# Patient Record
Sex: Female | Born: 1965 | Race: Black or African American | Hispanic: No | Marital: Single | Smoking: Current every day smoker
Health system: Southern US, Community
[De-identification: ages and names within clinical notes are randomized; demographics above are authoritative.]

## PROBLEM LIST (undated history)

## (undated) DIAGNOSIS — J45909 Unspecified asthma, uncomplicated: Secondary | ICD-10-CM

## (undated) HISTORY — PX: TUBAL LIGATION: SHX77

## (undated) HISTORY — PX: KNEE SURGERY: SHX244

---

## 2012-02-16 ENCOUNTER — Emergency Department (HOSPITAL_COMMUNITY)
Admission: EM | Admit: 2012-02-16 | Discharge: 2012-02-16 | Disposition: A | Discharge status: Home / Self Care | Payer: Self-pay | Attending: Emergency Medicine | Admitting: Emergency Medicine

## 2012-02-16 ENCOUNTER — Encounter (HOSPITAL_COMMUNITY): Payer: Self-pay | Admitting: Emergency Medicine

## 2012-02-16 DIAGNOSIS — L0291 Cutaneous abscess, unspecified: Secondary | ICD-10-CM

## 2012-02-16 DIAGNOSIS — F17200 Nicotine dependence, unspecified, uncomplicated: Secondary | ICD-10-CM | POA: Insufficient documentation

## 2012-02-16 DIAGNOSIS — IMO0002 Reserved for concepts with insufficient information to code with codable children: Secondary | ICD-10-CM | POA: Insufficient documentation

## 2012-02-16 DIAGNOSIS — J45909 Unspecified asthma, uncomplicated: Secondary | ICD-10-CM | POA: Insufficient documentation

## 2012-02-16 HISTORY — DX: Unspecified asthma, uncomplicated: J45.909

## 2012-02-16 MED ORDER — IBUPROFEN 600 MG PO TABS: 600.0000 mg | ORAL_TABLET | Freq: Four times a day (QID) | ORAL | Status: DC | PRN

## 2012-02-16 MED ORDER — SULFAMETHOXAZOLE-TRIMETHOPRIM 800-160 MG PO TABS: 1.0000 | ORAL_TABLET | Freq: Two times a day (BID) | ORAL | Status: DC

## 2012-02-16 MED ORDER — OXYCODONE-ACETAMINOPHEN 5-325 MG PO TABS: 2.0000 | ORAL_TABLET | ORAL | Status: DC | PRN

## 2012-02-16 MED ORDER — CLINDAMYCIN HCL 150 MG PO CAPS: 150.0000 mg | ORAL_CAPSULE | Freq: Four times a day (QID) | ORAL | Status: DC

## 2012-02-16 MED ORDER — OXYCODONE-ACETAMINOPHEN 5-325 MG PO TABS
2.0000 | ORAL_TABLET | Freq: Once | ORAL | Status: AC
  Administered 2012-02-16: 2 via ORAL
  Filled 2012-02-16: qty 2

## 2012-02-16 MED ORDER — CLINDAMYCIN HCL 150 MG PO CAPS
300.0000 mg | ORAL_CAPSULE | Freq: Once | ORAL | Status: AC
  Administered 2012-02-16: 300 mg via ORAL
  Filled 2012-02-16: qty 2

## 2012-02-16 MED ORDER — SULFAMETHOXAZOLE-TMP DS 800-160 MG PO TABS
1.0000 | ORAL_TABLET | Freq: Once | ORAL | Status: AC
  Administered 2012-02-16: 1 via ORAL
  Filled 2012-02-16: qty 1

## 2012-02-16 NOTE — ED Notes (Signed)
Pt states understanding of discharge instructions 

## 2012-02-16 NOTE — ED Provider Notes (Signed)
Medical screening examination/treatment/procedure(s) were performed by non-physician practitioner and as supervising physician I was immediately available for consultation/collaboration.  Derwood Kaplan, MD 02/16/12 [phone]

## 2012-02-16 NOTE — ED Notes (Signed)
Pt has abscess to L axilla, noticed about 1.5 days ago, pt has redness and swelling to under arm

## 2012-02-16 NOTE — ED Provider Notes (Signed)
History     CSN: [id number]  Arrival date & time 02/16/12  4098   First MD Initiated Contact with Patient 02/16/12 1958      Chief Complaint  Patient presents with  . Abscess    (Consider location/radiation/quality/duration/timing/severity/associated sxs/prior treatment) HPI Comments: Patient noticed a swollen area in her left axilla, week, and half, ago, that progressively got worse.  Several days ago.  It spontaneously ruptured after applying warm compresses over the last 24 hours increased in size and redness.  Pain has increased as well.  She denies generalized myalgias, or fever  Patient is a 46 y.o. female presenting with abscess. The history is provided by the patient.  Abscess  This is a new problem. The current episode started more than one week ago. The abscess is present on the left arm. The problem is severe. The abscess is characterized by redness, painfulness and swelling. The patient was exposed to OTC medications. Pertinent negatives include no fever.    Past Medical History  Diagnosis Date  . Asthma     Past Surgical History  Procedure Date  . Knee surgery   . Tubal ligation     No family history on file.  History  Substance Use Topics  . Smoking status: Current Every Day Smoker -- 0.5 packs/day    Types: Cigarettes  . Smokeless tobacco: Not on file  . Alcohol Use: Yes     Comment: occasion    OB History    Grav Para Term Preterm Abortions TAB SAB Ect Mult Living                  Review of Systems  Constitutional: Negative for fever and chills.  Gastrointestinal: Negative for nausea.  Musculoskeletal: Negative for joint swelling.  Skin: Positive for wound.  Neurological: Negative for dizziness and headaches.    Allergies  Amoxicillin  Home Medications  No current outpatient prescriptions on file.  BP 148/82  Pulse 105  Temp 98.2 F (36.8 C) (Oral)  Resp 18  SpO2 97%  LMP 02/13/2012  Physical Exam  Constitutional: She is  oriented to person, place, and time. She appears well-developed and well-nourished.  HENT:  Head: Normocephalic.  Eyes: Pupils are equal, round, and reactive to light.  Neck: Normal range of motion.  Cardiovascular: Normal rate.   Pulmonary/Chest: Effort normal.  Musculoskeletal: Normal range of motion. She exhibits tenderness.       In the left axilla  Neurological: She is alert and oriented to person, place, and time.  Skin: Skin is warm. There is erythema.       Abscess in the left axilla, approximately 10 cm by 5 cm no active drainage at this time    ED Course  INCISION AND DRAINAGE Date/Time: 02/16/2012 9:40 PM Performed by: Arman Filter Authorized by: Arman Filter Consent: Verbal consent obtained. Risks and benefits: risks, benefits and alternatives were discussed Consent given by: patient Patient understanding: patient states understanding of the procedure being performed Patient identity confirmed: verbally with patient Time out: Immediately prior to procedure a "time out" was called to verify the correct patient, procedure, equipment, support staff and site/side marked as required. Type: abscess Body area: upper extremity Location details: left arm Anesthesia: local infiltration Local anesthetic: lidocaine 2% with epinephrine Anesthetic total: 5 ml Patient sedated: no Scalpel size: 11 Needle gauge: 22 Incision type: elliptical Drainage: purulent Drainage amount: copious Wound treatment: wound left open Patient tolerance: Patient tolerated the procedure well with no immediate  complications.   (including critical care time)  Labs Reviewed - No data to display No results found.   No diagnosis found.    MDM   Emesis was approximately 3 cm deep loculations were broken, elliptical incision made approximately 2 cm long.  Patient was placed on clindamycin and Bactrim        Arman Filter, NP 02/16/12 2144

## 2012-02-16 NOTE — ED Notes (Signed)
Abcess x 1 week under left axilla

## 2013-04-21 ENCOUNTER — Encounter (HOSPITAL_COMMUNITY): Payer: Self-pay | Admitting: Emergency Medicine

## 2013-04-21 ENCOUNTER — Emergency Department (INDEPENDENT_AMBULATORY_CARE_PROVIDER_SITE_OTHER)
Admission: EM | Admit: 2013-04-21 | Discharge: 2013-04-21 | Disposition: A | Discharge status: Home / Self Care | Payer: Self-pay | Source: Home / Self Care | Attending: Emergency Medicine | Admitting: Emergency Medicine

## 2013-04-21 DIAGNOSIS — R69 Illness, unspecified: Secondary | ICD-10-CM

## 2013-04-21 DIAGNOSIS — J111 Influenza due to unidentified influenza virus with other respiratory manifestations: Secondary | ICD-10-CM

## 2013-04-21 DIAGNOSIS — IMO0001 Reserved for inherently not codable concepts without codable children: Secondary | ICD-10-CM

## 2013-04-21 DIAGNOSIS — R030 Elevated blood-pressure reading, without diagnosis of hypertension: Secondary | ICD-10-CM

## 2013-04-21 MED ORDER — OSELTAMIVIR PHOSPHATE 75 MG PO CAPS: 75.0000 mg | ORAL_CAPSULE | Freq: Two times a day (BID) | ORAL | Status: DC

## 2013-04-21 MED ORDER — ALBUTEROL SULFATE HFA 108 (90 BASE) MCG/ACT IN AERS: 1.0000 | INHALATION_SPRAY | Freq: Four times a day (QID) | RESPIRATORY_TRACT | Status: AC | PRN

## 2013-04-21 NOTE — ED Notes (Signed)
Pt  Has  Symptoms  Of  sorethroat            Chills  Body  Aches          With  The  Symptoms  X  2  Days             She   Reports  The  Symptoms     Not releived  By  otc  meds

## 2013-04-21 NOTE — ED Provider Notes (Signed)
Medical screening examination/treatment/procedure(s) were performed by non-physician practitioner and as supervising physician I was immediately available for consultation/collaboration.  Leslee Homeavid Shawnita Krizek, M.D.  Reuben Likesavid C Abdou Stocks, MD 04/21/13 1027

## 2013-04-21 NOTE — ED Provider Notes (Signed)
CSN: [id number]     Arrival date & time 04/21/13  5784 History   First MD Initiated Contact with Patient 04/21/13 [phone]     Chief Complaint  Patient presents with  . URI   (Consider location/radiation/quality/duration/timing/severity/associated sxs/prior Treatment) HPI Comments: No 2014 flu shot  Patient is a 48 y.o. female presenting with URI. The history is provided by the patient.  URI Presenting symptoms: congestion, cough, fatigue, rhinorrhea and sore throat   Presenting symptoms: no ear pain and no fever   Severity:  Moderate Onset quality:  Sudden Duration:  2 days Timing:  Constant Chronicity:  New Associated symptoms: myalgias and sneezing   Associated symptoms: no arthralgias, no headaches, no neck pain, no sinus pain, no swollen glands and no wheezing   Associated symptoms comment:  +mild diarrhea Risk factors: no immunosuppression     Past Medical History  Diagnosis Date  . Asthma    Past Surgical History  Procedure Laterality Date  . Knee surgery    . Tubal ligation     History reviewed. No pertinent family history. History  Substance Use Topics  . Smoking status: Current Every Day Smoker -- 0.50 packs/day    Types: Cigarettes  . Smokeless tobacco: Not on file  . Alcohol Use: Yes     Comment: occasion   OB History   Grav Para Term Preterm Abortions TAB SAB Ect Mult Living                 Review of Systems  Constitutional: Positive for chills and fatigue. Negative for fever.  HENT: Positive for congestion, rhinorrhea, sneezing and sore throat. Negative for ear pain, mouth sores, sinus pressure and trouble swallowing.   Eyes: Negative.   Respiratory: Positive for cough. Negative for chest tightness, shortness of breath, wheezing and stridor.   Gastrointestinal: Positive for diarrhea. Negative for vomiting and abdominal pain.  Endocrine: Negative for polydipsia, polyphagia and polyuria.  Genitourinary: Negative.   Musculoskeletal: Positive for myalgias.  Negative for arthralgias, gait problem, neck pain and neck stiffness.  Skin: Negative.   Allergic/Immunologic: Negative for immunocompromised state.  Neurological: Negative for weakness and headaches.  Hematological: Negative for adenopathy.  Psychiatric/Behavioral: Negative for behavioral problems.    Allergies  Amoxicillin  Home Medications   Current Outpatient Rx  Name  Route  Sig  Dispense  Refill  . clindamycin (CLEOCIN) 150 MG capsule   Oral   Take 1 capsule (150 mg total) by mouth every 6 (six) hours.   28 capsule   0   . ibuprofen (ADVIL,MOTRIN) 600 MG tablet   Oral   Take 1 tablet (600 mg total) by mouth every 6 (six) hours as needed for pain.   30 tablet   0   . oxyCODONE-acetaminophen (PERCOCET/ROXICET) 5-325 MG per tablet   Oral   Take 2 tablets by mouth every 4 (four) hours as needed for pain.   20 tablet   0   . sulfamethoxazole-trimethoprim (SEPTRA DS) 800-160 MG per tablet   Oral   Take 1 tablet by mouth 2 (two) times daily.   28 tablet   0    LMP 04/07/2013 Physical Exam  Nursing note and vitals reviewed. Constitutional: She is oriented to person, place, and time. She appears well-developed and well-nourished. No distress.  HENT:  Head: Normocephalic and atraumatic.  Right Ear: Hearing, tympanic membrane, external ear and ear canal normal.  Left Ear: Hearing, tympanic membrane, external ear and ear canal normal.  Nose: Nose normal.  Mouth/Throat: Uvula is midline, oropharynx is clear and moist and mucous membranes are normal. No oropharyngeal exudate.  Eyes: Conjunctivae are normal. Right eye exhibits no discharge. Left eye exhibits no discharge.  Neck: Normal range of motion. Neck supple.  Cardiovascular: Normal rate, regular rhythm and normal heart sounds.   Pulmonary/Chest: Effort normal and breath sounds normal. No respiratory distress.  +mild end expiratory wheeze at left upper field  Abdominal: Soft. Bowel sounds are normal. She exhibits  no distension. There is no tenderness.  Musculoskeletal: Normal range of motion.  Lymphadenopathy:    She has no cervical adenopathy.  Neurological: She is alert and oriented to person, place, and time.  Skin: Skin is warm and dry. No rash noted.  Psychiatric: She has a normal mood and affect. Her behavior is normal.    ED Course  Procedures (including critical care time) Labs Review Labs Reviewed - No data to display Imaging Review No results found.  EKG Interpretation    Date/Time:    Ventricular Rate:    PR Interval:    QRS Duration:   QT Interval:    QTC Calculation:   R Axis:     Text Interpretation:              MDM   Counseled patient regarding her elevated BP and advise she have it rechecked in 1-2 weeks by PCP. Will treat for influenza like illness with tamiflu and provide albuterol MDI for cough and wheezing. Advised to discontinue smoking. Educated about warning signs for return.    Jess BartersJennifer Lee EldoraPresson, GeorgiaPA 04/21/13 [phone]

## 2013-04-21 NOTE — Discharge Instructions (Signed)
PLease use medications as directed. Try to stop smoking. You should be aware that your blood pressure at today's visit was elevated (140/93) and this should be rechecked by a primary care doctor in 1-2 weeks.

## 2014-09-05 ENCOUNTER — Emergency Department (HOSPITAL_COMMUNITY): Payer: BLUE CROSS/BLUE SHIELD

## 2014-09-05 ENCOUNTER — Emergency Department (HOSPITAL_COMMUNITY)
Admission: EM | Admit: 2014-09-05 | Discharge: 2014-09-05 | Disposition: A | Discharge status: Home / Self Care | Payer: BLUE CROSS/BLUE SHIELD | Attending: Emergency Medicine | Admitting: Emergency Medicine

## 2014-09-05 ENCOUNTER — Encounter (HOSPITAL_COMMUNITY): Payer: Self-pay | Admitting: *Deleted

## 2014-09-05 DIAGNOSIS — Z79899 Other long term (current) drug therapy: Secondary | ICD-10-CM | POA: Insufficient documentation

## 2014-09-05 DIAGNOSIS — J45909 Unspecified asthma, uncomplicated: Secondary | ICD-10-CM | POA: Diagnosis not present

## 2014-09-05 DIAGNOSIS — Z720 Tobacco use: Secondary | ICD-10-CM | POA: Insufficient documentation

## 2014-09-05 DIAGNOSIS — M25571 Pain in right ankle and joints of right foot: Secondary | ICD-10-CM | POA: Diagnosis present

## 2014-09-05 DIAGNOSIS — Z880 Allergy status to penicillin: Secondary | ICD-10-CM | POA: Diagnosis not present

## 2014-09-05 MED ORDER — TRAMADOL HCL 50 MG PO TABS
50.0000 mg | ORAL_TABLET | Freq: Once | ORAL | Status: AC
  Administered 2014-09-05: 50 mg via ORAL
  Filled 2014-09-05: qty 1

## 2014-09-05 MED ORDER — TRAMADOL HCL 50 MG PO TABS: 50.0000 mg | ORAL_TABLET | Freq: Four times a day (QID) | ORAL | Status: DC | PRN

## 2014-09-05 MED ORDER — NAPROXEN 500 MG PO TABS: 500.0000 mg | ORAL_TABLET | Freq: Two times a day (BID) | ORAL | Status: DC

## 2014-09-05 NOTE — ED Notes (Signed)
Declined W/C at D/C and was escorted to lobby by RN. 

## 2014-09-05 NOTE — ED Provider Notes (Signed)
CSN: [id number]     Arrival date & time 09/05/14  0903 History  This chart was scribed for non-physician practitioner, Santiago GladHeather Xavia Kniskern, PA-C, working with Richardean Canalavid H Yao, MD, by Ronney LionSuzanne Le, ED Scribe. This patient was seen in room TR09C/TR09C and the patient's care was started at 10:04 AM.    Chief Complaint  Patient presents with  . Ankle Pain   The history is provided by the patient. No language interpreter was used.     HPI Comments: Teresa Rosales is a 49 y.o. female who presents to the Emergency Department complaining of constant, burning right ankle pain that began when she woke up yesterday. She states she got up to go to the bathroom and was unable to ambulate secondary to pain. She denies any known injury. She has never had pain like this before. Patient complains of associated swelling to the top, back, and side of the right ankle. Raising her right foot triggers a throbbing sensation. She has tried heat, ice, and ibuprofen with some relief. 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   Past Medical History  Diagnosis Date  . Asthma    Past Surgical History  Procedure Laterality Date  . Knee surgery    . Tubal ligation     History reviewed. No pertinent family history. History  Substance Use Topics  . Smoking status: Current Every Day Smoker -- 0.50 packs/day    Types: Cigarettes  . Smokeless tobacco: Never Used  . Alcohol Use: Yes     Comment: occasion   OB History    No data available     Review of Systems  Constitutional: Negative for fever and chills.  Musculoskeletal: Positive for arthralgias.  Neurological: Negative for numbness.   Allergies  Amoxicillin  Home Medications   Prior to Admission medications   Medication Sig Start Date End Date Taking? Authorizing Provider  albuterol (PROVENTIL HFA;VENTOLIN HFA) 108 (90 BASE) MCG/ACT inhaler Inhale 1-2 puffs into the lungs every 6 (six) hours as needed for wheezing or shortness of breath. 04/21/13    Mathis FareJennifer Lee H Presson, PA  clindamycin (CLEOCIN) 150 MG capsule Take 1 capsule (150 mg total) by mouth every 6 (six) hours. 02/16/12   Earley FavorGail Schulz, NP  ibuprofen (ADVIL,MOTRIN) 600 MG tablet Take 1 tablet (600 mg total) by mouth every 6 (six) hours as needed for pain. 02/16/12   Earley FavorGail Schulz, NP  oseltamivir (TAMIFLU) 75 MG capsule Take 1 capsule (75 mg total) by mouth every 12 (twelve) hours. X 5 days 04/21/13   Ria ClockJennifer Lee H Presson, PA  oxyCODONE-acetaminophen (PERCOCET/ROXICET) 5-325 MG per tablet Take 2 tablets by mouth every 4 (four) hours as needed for pain. 02/16/12   Earley FavorGail Schulz, NP  sulfamethoxazole-trimethoprim (SEPTRA DS) 800-160 MG per tablet Take 1 tablet by mouth 2 (two) times daily. 02/16/12   Earley FavorGail Schulz, NP   BP 140/74 mmHg  Pulse 78  Temp(Src) 97.8 F (36.6 C) (Oral)  Resp 18  SpO2 98%  LMP 09/01/2014 Physical Exam  Constitutional: She is oriented to person, place, and time. She appears well-developed and well-nourished. No distress.  HENT:  Head: Normocephalic and atraumatic.  Eyes: Conjunctivae and EOM are normal.  Neck: Neck supple. No tracheal deviation present.  Cardiovascular: Normal rate, regular rhythm and normal heart sounds.   Pulses:      Dorsalis pedis pulses are 2+ on the right side, and 2+ on the left side.  Pulmonary/Chest: Effort normal and breath sounds normal. No respiratory  distress.  Musculoskeletal: Normal range of motion. She exhibits tenderness.  TTP to the lateral malleolus and also the Achilles'. Achilles' tendon is intact. 2+ DP pulses. There is no warmth or erythema of the right foot or ankle.   Neurological: She is alert and oriented to person, place, and time.  Distal sensation of right foot is intact.   Skin: Skin is warm and dry.  Psychiatric: She has a normal mood and affect. Her behavior is normal.  Nursing note and vitals reviewed.   ED Course  Procedures (including critical care time)  DIAGNOSTIC STUDIES: Oxygen Saturation is 98%  on RA, normal by my interpretation.    COORDINATION OF CARE: 10:10 AM - Discussed XR results with patient. Suspect arthritis. Discussed treatment plan with pt at bedside which includes referral to orthopedist, ACE wrap, and anti-inflammatory medication, and pt agreed to plan.   Imaging Review Dg Ankle Complete Right  09/05/2014   CLINICAL DATA:  Posterior and lateral right ankle pain, no known injury  EXAM: RIGHT ANKLE - COMPLETE 3+ VIEW  COMPARISON:  None.  FINDINGS: Three views of the right ankle submitted. No acute fracture or subluxation. There is large plantar spur of calcaneus. Tendinous calcifications are noted in Achilles tendon adjacent to calcaneal insertion. Ankle mortise is preserved.  IMPRESSION: No acute fracture or subluxation. Plantar spur of calcaneus. Calcifications of Achilles tendon.   Electronically Signed   By: Natasha Mead M.D.   On: 09/05/2014 09:39     MDM   Final diagnoses:  None   Patient presents today with right ankle pain.  No acute findings on xray.  Xray does show a plantar spur of the calcaneus.  No signs of infection on exam.  Patient stable for discharge.  Return precautions given. I personally performed the services described in this documentation, which was scribed in my presence. The recorded information has been reviewed and is accurate.     Santiago Glad, PA-C 09/07/14 4401  Richardean Canal, MD 09/08/14 [phone]

## 2014-09-05 NOTE — ED Notes (Signed)
RT ankle pain since Sunday with out injury.

## 2014-12-04 ENCOUNTER — Emergency Department (HOSPITAL_COMMUNITY)
Admission: EM | Admit: 2014-12-04 | Discharge: 2014-12-04 | Disposition: A | Discharge status: Home / Self Care | Payer: BLUE CROSS/BLUE SHIELD | Attending: Emergency Medicine | Admitting: Emergency Medicine

## 2014-12-04 ENCOUNTER — Encounter (HOSPITAL_COMMUNITY): Payer: Self-pay

## 2014-12-04 DIAGNOSIS — M62830 Muscle spasm of back: Secondary | ICD-10-CM | POA: Diagnosis not present

## 2014-12-04 DIAGNOSIS — J45909 Unspecified asthma, uncomplicated: Secondary | ICD-10-CM | POA: Insufficient documentation

## 2014-12-04 DIAGNOSIS — Y9389 Activity, other specified: Secondary | ICD-10-CM | POA: Insufficient documentation

## 2014-12-04 DIAGNOSIS — Z791 Long term (current) use of non-steroidal anti-inflammatories (NSAID): Secondary | ICD-10-CM | POA: Insufficient documentation

## 2014-12-04 DIAGNOSIS — Z880 Allergy status to penicillin: Secondary | ICD-10-CM | POA: Insufficient documentation

## 2014-12-04 DIAGNOSIS — X58XXXA Exposure to other specified factors, initial encounter: Secondary | ICD-10-CM | POA: Diagnosis not present

## 2014-12-04 DIAGNOSIS — Z79899 Other long term (current) drug therapy: Secondary | ICD-10-CM | POA: Diagnosis not present

## 2014-12-04 DIAGNOSIS — Z720 Tobacco use: Secondary | ICD-10-CM | POA: Diagnosis not present

## 2014-12-04 DIAGNOSIS — Y998 Other external cause status: Secondary | ICD-10-CM | POA: Diagnosis not present

## 2014-12-04 DIAGNOSIS — Y9289 Other specified places as the place of occurrence of the external cause: Secondary | ICD-10-CM | POA: Insufficient documentation

## 2014-12-04 DIAGNOSIS — Z792 Long term (current) use of antibiotics: Secondary | ICD-10-CM | POA: Diagnosis not present

## 2014-12-04 DIAGNOSIS — S3992XA Unspecified injury of lower back, initial encounter: Secondary | ICD-10-CM | POA: Diagnosis present

## 2014-12-04 MED ORDER — CYCLOBENZAPRINE HCL 10 MG PO TABS
10.0000 mg | ORAL_TABLET | Freq: Once | ORAL | Status: AC
  Administered 2014-12-04: 10 mg via ORAL
  Filled 2014-12-04: qty 1

## 2014-12-04 MED ORDER — CYCLOBENZAPRINE HCL 10 MG PO TABS: 10.0000 mg | ORAL_TABLET | Freq: Two times a day (BID) | ORAL | Status: DC | PRN

## 2014-12-04 NOTE — Discharge Instructions (Signed)
Please follow up with a primary care provider from the resource guide listed below. Please take Flexeril and Ibuprofen for pain relief as prescribed. You may use a heating pad for symptomatic relief. Please return to the Emergency department if symptoms worsen or you begin to have fever, numbness, tingling, weakness, impaired ambulation, abdominal pain, nausea, vomiting.     Emergency Department Resource Guide 1) Find a Doctor and Pay Out of Pocket Although you won't have to find out who is covered by your insurance plan, it is a good idea to ask around and get recommendations. You will then need to call the office and see if the doctor you have chosen will accept you as a new patient and what types of options they offer for patients who are self-pay. Some doctors offer discounts or will set up payment plans for their patients who do not have insurance, but you will need to ask so you aren't surprised when you get to your appointment.  2) Contact Your Local Health Department Not all health departments have doctors that can see patients for sick visits, but many do, so it is worth a call to see if yours does. If you don't know where your local health department is, you can check in your phone book. The CDC also has a tool to help you locate your state's health department, and many state websites also have listings of all of their local health departments.  3) Find a Walk-in Clinic If your illness is not likely to be very severe or complicated, you may want to try a walk in clinic. These are popping up all over the country in pharmacies, drugstores, and shopping centers. They're usually staffed by nurse practitioners or physician assistants that have been trained to treat common illnesses and complaints. They're usually fairly quick and inexpensive. However, if you have serious medical issues or chronic medical problems, these are probably not your best option.  No Primary Care Doctor: - Call Health  Connect at  [phone] - they can help you locate a primary care doctor that  accepts your insurance, provides certain services, etc. - Physician Referral Service- [phone]  Chronic Pain Problems: Organization         Address  Phone   Notes  [facility]  [phone] Patients need to be referred by their primary care doctor.   Medication Assistance: Organization         Address  Phone   Notes  [facility] Medication [facility] [address] [place], [place] [address] [phone] --Must be a resident of [facility] -- Must have NO insurance coverage whatsoever (no Medicaid/ Medicare, etc.) -- The pt. MUST have a primary care doctor that directs their care regularly and follows them in the community   [facility]  [phone]   [employer]  [phone]    Agencies that provide inexpensive medical care: Organization         Address  Phone   Notes  [facility]  [phone]   [facility]    [phone]   [facility] [address] [place], [place] [address] [phone]   [facility] [address] [place], [place] [phone]   Planned Parenthood    [phone]   [facility]    [phone]   Community [facility] and [facility]  [address], [place] Phone:  [phone],  Fax:  [phone] Hours of Operation:  9 am - 6 pm, M-F.  Also accepts Medicaid/Medicare and self-pay.  [facility] for [place], [address], [place] Phone: [phone], Fax: [phone]. Hours of Operation:  8:30 am - 5:30 pm, M-F.  Also accepts Medicaid and self-pay.  [facility] [place] [address] [place] Phone: [phone]   [place], [place], [place] [phone], Ext. [phone] Mondays & Thursdays: 7-9 AM.  First 15 patients are seen on a first come, first serve basis.     [place] Providers:  Organization         Address  Phone   Notes  [facility] [address], [place] [phone] Also accepts self-pay patients.  [facility] [address], [place]  [phone]   [place], [address], [place] [phone]   [facility] [address], [place] [phone]   Lucianne Lei [address], [place]   [phone] Only accepts [place] Access [place] patients after they have their name applied to their card.   Self-Pay (no insurance) in [facility]:  Organization         Address  Phone   Notes  Sickle Cell Patients, [facility] [place] [phone]   [facility] [place] [phone]   [facility] [place]  [place], [place], [place] [phone]   [facility]/Dr. Osei-Bonsu  [address], [place] or [address], [place] [phone] Phone number for both [place] and [place] locations is the same.  Urgent Medical and [facility] [address] [place] [phone]   [facility] [address] [place] or [address] [phone]   [facility] [address], [place] [phone], phone; [phone], fax Sees patients 1st and 3rd Saturday of every month.  Must not qualify for public or private insurance (i.e. Medicaid, Medicare, [place] Health Choice, Veterans' Benefits)  Household income should be no more than 200% of the poverty level The clinic cannot treat you if you are pregnant or think you are pregnant  Sexually transmitted diseases are not treated at the clinic.    Dental Care: Organization         Address  Phone  Notes  [facility]  Department of [facility] [place] [phone] Accepts children up to age 78 who are enrolled in [place] or [place]; pregnant women with a Medicaid card; and children who have applied for Medicaid or [place] Health Choice, but were declined, whose parents can pay a reduced fee at time of service.  [facility] Department of [facility]  [address], [place] [phone] Accepts children up to age 26 who are enrolled in [place] or [place]; pregnant women with a Medicaid card; and children who have applied for Medicaid or [place] Health Choice, but were declined, whose parents can pay a reduced fee at time of service.  [place] [facility] Access PRO[facility]  [place] [phone] Patients are seen by appointment only. Walk-ins are not  accepted. [place] will see patients 36 years of age and older. Monday - Tuesday (8am-5pm) Most Wednesdays (8:30-5pm) $30 per visit, cash only  [facility] Adult [facility] Access PRO[facility]  [address], [facility] [phone] Patients are seen by appointment only. Walk-ins are not accepted. [place] will see patients 36 years of age and older. One Wednesday Evening (Monthly: Volunteer Based).  $30 per visit, cash only  [place]  [phone] for adults; Children under age 81, call Graduate Pediatric Dentistry at [phone]. Children aged 62-14, please call [phone] to request a pediatric application.  Dental services are provided in all areas of dental care including fillings, crowns and bridges, complete and partial dentures, implants, gum treatment, root canals, and extractions. Preventive care is also provided. Treatment is provided to both adults and children. Patients are selected via a lottery and there is often a waiting list.   [facility] [address] [place]  [phone]  [email or web address]   [facility] [address] [place], [place] [phone], Ext. [phone] Second and Fourth Thursday of each month, opens at 6:30 AM; Clinic ends at 9 AM.  Patients are seen on a first-come first-served basis, and a limited number are seen during each clinic.   [facility]  [address] Hillard Danker [place], [place] [phone]   Eligibility Requirements You must have lived in [place], [place], or [place] counties for at least the last three months.   You cannot be eligible for state or federal sponsored [employer], including [employer], [place], or [employer].   You generally cannot be eligible for healthcare insurance through your employer.    How to apply: Eligibility screenings are held every Tuesday and Wednesday afternoon from 1:00 pm until 4:00 pm. You do not need an appointment for the interview!  [facility] [address] [place], [place]   [place]  [place] Department  [place]  [phone]    Behavioral Health Resources in the Community: Intensive Outpatient Programs Organization         Address  Phone  Notes  [facility] [place]. [address] [place], [place] [phone]   [facility] Outpatient [address], [place], [place]   ADS: Alcohol & Drug Svcs [address], [place], [place]   [place] [address]  [place], [place] or [phone]   Substance Abuse Resources Organization         Address  Phone  Notes  Alcohol and Drug Services  [phone]   [place]  [phone]   [facility] [place]   Chinita Pester  [phone]   Residential & Outpatient Substance Abuse Program  [phone]   Psychological Services Organization          Address  Phone  Notes  [facility] [place]  [place]  [phone]   [place] [address], [place] or [phone]    Mobile Crisis Teams Organization         Address  Phone  Notes  [facility], Mobile Crisis Care Unit  [phone]   [facility]  [address] [place], [place]   [place] [address], [place] [phone]    Self-Help/Support Groups  Organization         Address  Phone             Notes  [facility] [place] - variety of support groups  [phone] [phone] Call for more information  Narcotics Anonymous (NA), [facility] [address], [place]  2 meetings at this location   Statisticianesidential Treatment Programs Organization         Address  Phone  Notes  [facility] [address],    [place]  [phone]   [facility]  [address], [place] [phone], [place], [place] [phone]   [facility] [address], [place] [phone] Admissions: 8am-3pm M-F  [facility] [address]    [place], [place] [phone]   [facility] [address], [place], [place] [phone]   [facility] [address]  [place], [place] [phone]   [facility] - Intensive Outpatient [address], [place], [place] [phone]   [facility] ([facility]iction [facility] [address]  [place], [place] [phone] or [phone]   [facility] (RTS) [address] [place], [place] [phone] Accepts Medicaid  [facility] [address]  [place] [phone] Substance Abuse/Addiction Treatment   [facility] Behavioral Health Resources Organization         Address  Phone  Notes  [facility]  [phone]) [phone]   Angie FavaJulie Brannon, PhD [address], [address] [place], [place]   [phone] or [phone]) [phone]   [facility]   [address] [place], [place] [phone]) [phone]   [facility] [address], [place], [place] [phone]) [phone] Insurance/Medicaid/sponsorship through [facility]  [facility] [address]                                    [place], [place] [phone]) [phone] Therapy/tele-psych/case  [facility] [address]   [place], [place] [phone]) [phone]    Dr. Lolly MustacheArfeen  [phone]   [facility] [place]  [employer] [facility] 1) [address], [place] 2) [address], [place] 3)  [address], [place] [phone]  [phone](336) [phone]   [facility] Child Abuse Hotline [phone]) [phone] or [phone]) [phone] (After Hours)

## 2014-12-04 NOTE — ED Notes (Signed)
Declined W/C at D/C and was escorted to lobby by RN. 

## 2014-12-04 NOTE — ED Notes (Signed)
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  No pain, numbness/tingling ion LE.  No bowel/bladder problems.

## 2014-12-04 NOTE — ED Provider Notes (Signed)
CSN: [id number]     Arrival date & time 12/04/14  1216 History   First MD Initiated Contact with Patient 12/04/14 1300     Chief Complaint  Patient presents with  . Back Pain     (Consider location/radiation/quality/duration/timing/severity/associated sxs/prior Treatment) HPI Comments: Pt is a 49 yo female who presents to the ED with complaint right lower back pain, onset yesterday. 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 Pt reports the pain is located to her right lower back without radiation. Denies fever, headache, abdominal pain, N/V/D/C, urinary sxs, loss of control of bowel or bladder, saddle anesthesia, or sciatic pain. She notes she has a history of back spasms and reports that she usually gets them in the same region. She notes she has been using ibuprofen, ice and heat at home without much relief.  Patient is a 49 y.o. female presenting with back pain.  Back Pain   Past Medical History  Diagnosis Date  . Asthma    Past Surgical History  Procedure Laterality Date  . Knee surgery    . Tubal ligation     History reviewed. No pertinent family history. Social History  Substance Use Topics  . Smoking status: Current Every Day Smoker -- 0.50 packs/day    Types: Cigarettes  . Smokeless tobacco: Never Used  . Alcohol Use: 2.4 oz/week    4 Glasses of wine per week     Comment: weekends    OB History    No data available     Review of Systems  Musculoskeletal: Positive for back pain.  All other systems reviewed and are negative.     Allergies  Amoxicillin  Home Medications   Prior to Admission medications   Medication Sig Start Date End Date Taking? Authorizing Provider  albuterol (PROVENTIL HFA;VENTOLIN HFA) 108 (90 BASE) MCG/ACT inhaler Inhale 1-2 puffs into the lungs every 6 (six) hours as needed for wheezing or shortness  of breath. 04/21/13   Mathis Fare Presson, PA  clindamycin (CLEOCIN) 150 MG capsule Take 1 capsule (150 mg total) by mouth every 6 (six) hours. 02/16/12   Earley Favor, NP  ibuprofen (ADVIL,MOTRIN) 600 MG tablet Take 1 tablet (600 mg total) by mouth every 6 (six) hours as needed for pain. 02/16/12   Earley Favor, NP  naproxen (NAPROSYN) 500 MG tablet Take 1 tablet (500 mg total) by mouth 2 (two) times daily. 09/05/14   Santiago Glad, PA-C  oseltamivir (TAMIFLU) 75 MG capsule Take 1 capsule (75 mg total) by mouth every 12 (twelve) hours. X 5 days 04/21/13   Ria Clock, PA  oxyCODONE-acetaminophen (PERCOCET/ROXICET) 5-325 MG per tablet Take 2 tablets by mouth every 4 (four) hours as needed for pain. 02/16/12   Earley Favor, NP  sulfamethoxazole-trimethoprim (SEPTRA DS) 800-160 MG per tablet Take 1 tablet by mouth 2 (two) times daily. 02/16/12   Earley Favor, NP  traMADol (ULTRAM) 50 MG tablet Take 1 tablet (50 mg total) by mouth every 6 (six) hours as needed. 09/05/14   Santiago Glad, PA-C   LMP 12/02/2014 Physical Exam  Constitutional: She is oriented to person, place, and time. She appears well-developed and well-nourished.  HENT:  Head: Normocephalic and atraumatic.  Eyes: Conjunctivae and EOM are normal. Right eye exhibits no discharge. Left eye exhibits no discharge. No scleral icterus.  Neck: Normal range of motion. Neck supple.  Cardiovascular: Normal rate.   Pulmonary/Chest: Effort normal.  Abdominal: Soft. Bowel sounds are normal. There is no tenderness.  Musculoskeletal:       Lumbar back: She exhibits no swelling, no edema and no deformity.  TTP along right lumbar paraspinal muscles, no muscle spasm palpated. Pt is able to stand and ambulate in the room but ROM is limited due to pain. Sensation intact to bilateral lower extremities, 5/5 strength, 2+ distal pulses.  Neurological: She is alert and oriented to person, place, and time. She has normal reflexes.  Skin: Skin is warm and  dry.    ED Course  Procedures (including critical care time) Labs Review Labs Reviewed - No data to display  Imaging Review No results found. I have personally reviewed and evaluated these images and lab results as part of my medical decision-making.  Filed Vitals:   12/04/14 1341  BP: 132/73  Pulse: 71  Temp: 98.6 F (37 C)  Resp: 16   Meds given in ED:  Medications  cyclobenzaprine (FLEXERIL) tablet 10 mg (10 mg Oral Given 12/04/14 1340)    Discharge Medication List as of 12/04/2014  1:33 PM    START taking these medications   Details  cyclobenzaprine (FLEXERIL) 10 MG tablet Take 1 tablet (10 mg total) by mouth 2 (two) times daily as needed for muscle spasms., Starting 12/04/2014, Until Discontinued, Print         MDM   Final diagnoses:  Back muscle spasm    Pt presents with muscle spasm to right lumbar paraspinal muscles with no radiation. Pt reports history of back spasm.   Back pain likely due to muscle spasm or strain due to mechanism of action (lifting and twisting while at work). No fever, bowel or bladder incontinence, saddle anesthesia, numbness, tingling, weakness.  Pt give Flexeril for pain relief. Will plan to d/c pt home with rx for flexeril and to follow up with PCP.  Discussed plan for d/c with pt. Pt advised to take flexeril as prescribed for pain relief and to also use Ibuprofen and heating pad at home for pain. Pt given note for work and resource guide to use to find a PCP to follow up with. Pt agrees with plan and has family member with her to drive her home. Pt advised to return to the ED if sxs worsen.    Satira Sark [place], [place] 12/04/14 1446  Pricilla Loveless, MD 12/12/14 [phone]

## 2015-07-02 ENCOUNTER — Emergency Department (HOSPITAL_COMMUNITY): Payer: BLUE CROSS/BLUE SHIELD

## 2015-07-02 ENCOUNTER — Emergency Department (HOSPITAL_COMMUNITY)
Admission: EM | Admit: 2015-07-02 | Discharge: 2015-07-02 | Disposition: A | Discharge status: Home / Self Care | Payer: BLUE CROSS/BLUE SHIELD | Attending: Physician Assistant | Admitting: Physician Assistant

## 2015-07-02 ENCOUNTER — Encounter (HOSPITAL_COMMUNITY): Payer: Self-pay | Admitting: *Deleted

## 2015-07-02 DIAGNOSIS — Z79899 Other long term (current) drug therapy: Secondary | ICD-10-CM | POA: Diagnosis not present

## 2015-07-02 DIAGNOSIS — Z880 Allergy status to penicillin: Secondary | ICD-10-CM | POA: Insufficient documentation

## 2015-07-02 DIAGNOSIS — B349 Viral infection, unspecified: Secondary | ICD-10-CM

## 2015-07-02 DIAGNOSIS — R05 Cough: Secondary | ICD-10-CM | POA: Diagnosis present

## 2015-07-02 DIAGNOSIS — F17210 Nicotine dependence, cigarettes, uncomplicated: Secondary | ICD-10-CM | POA: Insufficient documentation

## 2015-07-02 DIAGNOSIS — J45901 Unspecified asthma with (acute) exacerbation: Secondary | ICD-10-CM | POA: Diagnosis not present

## 2015-07-02 MED ORDER — IBUPROFEN 800 MG PO TABS: 800.0000 mg | ORAL_TABLET | Freq: Three times a day (TID) | ORAL | Status: DC

## 2015-07-02 MED ORDER — IPRATROPIUM-ALBUTEROL 0.5-2.5 (3) MG/3ML IN SOLN
3.0000 mL | Freq: Once | RESPIRATORY_TRACT | Status: AC
  Administered 2015-07-02: 3 mL via RESPIRATORY_TRACT
  Filled 2015-07-02: qty 3

## 2015-07-02 MED ORDER — PREDNISONE 20 MG PO TABS: ORAL_TABLET | ORAL | Status: AC

## 2015-07-02 MED ORDER — GUAIFENESIN-CODEINE 100-10 MG/5ML PO SOLN: 5.0000 mL | Freq: Four times a day (QID) | ORAL | Status: DC | PRN

## 2015-07-02 MED ORDER — PREDNISONE 20 MG PO TABS
60.0000 mg | ORAL_TABLET | Freq: Once | ORAL | Status: AC
  Administered 2015-07-02: 60 mg via ORAL
  Filled 2015-07-02: qty 3

## 2015-07-02 NOTE — ED Notes (Signed)
Pt reports flu like symptoms since Friday. Having cough, headache, bodyaches, fever. Mask on pt at triage.

## 2015-07-02 NOTE — ED Provider Notes (Signed)
CSN: [id number]     Arrival date & time 07/02/15  [id number] History   First MD Initiated Contact with Patient 07/02/15 [phone]     Chief Complaint  Patient presents with  . Influenza     (Consider location/radiation/quality/duration/timing/severity/associated sxs/prior Treatment) HPI   50 year old history of asthma presenting with flu-like symtpoms.  Symptoms started Frdiay night.  Symptoms of runny nose cough, sneeezing.  Myalgias. No fever.  +Chills.   Nyquil has only helped mildly.     Past Medical History  Diagnosis Date  . Asthma    Past Surgical History  Procedure Laterality Date  . Knee surgery    . Tubal ligation     History reviewed. No pertinent family history. Social History  Substance Use Topics  . Smoking status: Current Every Day Smoker -- 0.50 packs/day    Types: Cigarettes  . Smokeless tobacco: Never Used  . Alcohol Use: 2.4 oz/week    4 Glasses of wine per week     Comment: weekends    OB History    No data available     Review of Systems  Constitutional: Positive for fever. Negative for activity change.  HENT: Positive for congestion and sneezing.   Respiratory: Positive for cough. Negative for shortness of breath.   Cardiovascular: Negative for chest pain.  Gastrointestinal: Negative for nausea, vomiting, abdominal pain and diarrhea.  Psychiatric/Behavioral: Negative for agitation.      Allergies  Amoxicillin  Home Medications   Prior to Admission medications   Medication Sig Start Date End Date Taking? Authorizing Provider  albuterol (PROVENTIL HFA;VENTOLIN HFA) 108 (90 BASE) MCG/ACT inhaler Inhale 1-2 puffs into the lungs every 6 (six) hours as needed for wheezing or shortness of breath. 04/21/13   Mathis FareJennifer Lee H Presson, PA  clindamycin (CLEOCIN) 150 MG capsule Take 1 capsule (150 mg total) by mouth every 6 (six) hours. Patient not taking: Reported on 07/02/2015 02/16/12   Earley FavorGail Schulz, NP  cyclobenzaprine (FLEXERIL) 10 MG tablet Take 1 tablet  (10 mg total) by mouth 2 (two) times daily as needed for muscle spasms. Patient not taking: Reported on 07/02/2015 12/04/14   Barrett HenleNicole Elizabeth Nadeau, PA-C  guaiFENesin-codeine 100-10 MG/5ML syrup Take 5 mLs by mouth every 6 (six) hours as needed for cough. 07/02/15   Courteney Lyn Mackuen, MD  ibuprofen (ADVIL,MOTRIN) 800 MG tablet Take 1 tablet (800 mg total) by mouth 3 (three) times daily. 07/02/15   Courteney Lyn Mackuen, MD  naproxen (NAPROSYN) 500 MG tablet Take 1 tablet (500 mg total) by mouth 2 (two) times daily. Patient not taking: Reported on 07/02/2015 09/05/14   Santiago GladHeather Laisure, PA-C  oseltamivir (TAMIFLU) 75 MG capsule Take 1 capsule (75 mg total) by mouth every 12 (twelve) hours. X 5 days Patient not taking: Reported on 07/02/2015 04/21/13   Ria ClockJennifer Lee H Presson, PA  oxyCODONE-acetaminophen (PERCOCET/ROXICET) 5-325 MG per tablet Take 2 tablets by mouth every 4 (four) hours as needed for pain. Patient not taking: Reported on 07/02/2015 02/16/12   Earley FavorGail Schulz, NP  predniSONE (DELTASONE) 20 MG tablet Day 1 and 2: Take 3 tabs  Day 3-5: Take 2 tabs.  Day 5-8: take 1 tab 07/02/15   Courteney Lyn Mackuen, MD  sulfamethoxazole-trimethoprim (SEPTRA DS) 800-160 MG per tablet Take 1 tablet by mouth 2 (two) times daily. Patient not taking: Reported on 07/02/2015 02/16/12   Earley FavorGail Schulz, NP  traMADol (ULTRAM) 50 MG tablet Take 1 tablet (50 mg total) by mouth every 6 (six) hours as  needed. Patient not taking: Reported on 07/02/2015 09/05/14   Santiago Glad, PA-C   BP 138/77 mmHg  Pulse 66  Temp(Src) 98.4 F (36.9 C) (Oral)  Resp 19  SpO2 100%  LMP 07/02/2015 Physical Exam  Constitutional: She is oriented to person, place, and time. She appears well-developed and well-nourished.  HENT:  Head: Normocephalic and atraumatic.  Eyes: Conjunctivae are normal. Right eye exhibits no discharge.  Neck: Neck supple.  Cardiovascular: Normal rate, regular rhythm and normal heart sounds.   No murmur  heard. Pulmonary/Chest: Effort normal. She has wheezes. She has no rales.  Abdominal: Soft. She exhibits no distension. There is no tenderness.  Musculoskeletal: Normal range of motion. She exhibits no edema.  Neurological: She is oriented to person, place, and time. No cranial nerve deficit.  Skin: Skin is warm and dry. No rash noted. She is not diaphoretic.  Psychiatric: She has a normal mood and affect. Her behavior is normal.  Nursing note and vitals reviewed.   ED Course  Procedures (including critical care time) Labs Review Labs Reviewed - No data to display  Imaging Review Dg Chest 2 View  07/02/2015  CLINICAL DATA:  Pt reports productive cough w/ yellow phlegm, runny nose, body aches, and chills x 2 days; smoker; h/o asthma EXAM: CHEST  2 VIEW COMPARISON:  None. FINDINGS: The heart size and mediastinal contours are within normal limits. Both lungs are clear. The visualized skeletal structures are unremarkable. IMPRESSION: No active cardiopulmonary disease. Electronically Signed   By: Norva Pavlov M.D.   On: 07/02/2015 10:52   I have personally reviewed and evaluated these images and lab results as part of my medical decision-making.   EKG Interpretation None      MDM   Final diagnoses:  Viral syndrome   Patient is a 50 year old female presenting with flulike symptoms. 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 Patient's been feeling ill since Friday. Patient has no appreciable temperature on arrival here. She has normal vital signs. Physical exam is significant for mild wheezing. Patient has an history of asthma. We'll do chest x-ray to rule out occult infection. We'll give DuoNeb for symptomatic care. Plan to discharge home with symptomatic care.       Courteney Randall An, MD 07/02/15 [phone]

## 2015-07-02 NOTE — Discharge Instructions (Signed)
We think he likely has the flu. Please drink plenty of fluids. Please use her albuterol inhaler every 4 hours as needed. Please take the steroids to help calming inflammation in your lungs. Please follow upwith your primary care physician. Please return if you have any concerns.  Viral Infections A viral infection can be caused by different types of viruses.Most viral infections are not serious and resolve on their own. However, some infections may cause severe symptoms and may lead to further complications. SYMPTOMS Viruses can frequently cause:  Minor sore throat.  Aches and pains.  Headaches.  Runny nose.  Different types of rashes.  Watery eyes.  Tiredness.  Cough.  Loss of appetite.  Gastrointestinal infections, resulting in nausea, vomiting, and diarrhea. These symptoms do not respond to antibiotics because the infection is not caused by bacteria. However, you might catch a bacterial infection following the viral infection. This is sometimes called a "superinfection." Symptoms of such a bacterial infection may include:  Worsening sore throat with pus and difficulty swallowing.  Swollen neck glands.  Chills and a high or persistent fever.  Severe headache.  Tenderness over the sinuses.  Persistent overall ill feeling (malaise), muscle aches, and tiredness (fatigue).  Persistent cough.  Yellow, green, or brown mucus production with coughing. HOME CARE INSTRUCTIONS   Only take over-the-counter or prescription medicines for pain, discomfort, diarrhea, or fever as directed by your caregiver.  Drink enough water and fluids to keep your urine clear or pale yellow. Sports drinks can provide valuable electrolytes, sugars, and hydration.  Get plenty of rest and maintain proper nutrition. Soups and broths with crackers or rice are fine. SEEK IMMEDIATE MEDICAL CARE IF:   You have severe headaches, shortness of breath, chest pain, neck pain, or an unusual rash.  You  have uncontrolled vomiting, diarrhea, or you are unable to keep down fluids.  You or your child has an oral temperature above 102 F (38.9 C), not controlled by medicine.  Your baby is older than 3 months with a rectal temperature of 102 F (38.9 C) or higher.  Your baby is 563 months old or younger with a rectal temperature of 100.4 F (38 C) or higher. MAKE SURE YOU:   Understand these instructions.  Will watch your condition.  Will get help right away if you are not doing well or get worse.   This information is not intended to replace advice given to you by your health care provider. Make sure you discuss any questions you have with your health care provider.   Document Released: 01/09/2005 Document Revised: 06/24/2011 Document Reviewed: 09/07/2014 Elsevier Interactive Patient Education [employer]2016 [employer] Inc.

## 2015-07-02 NOTE — ED Notes (Signed)
Called xray regarding delay, will come get patient now

## 2015-07-02 NOTE — ED Notes (Signed)
Patient reports productive cough, body aches, and sinus pressure since Friday. Patient works in [employer]. No fevers.

## 2016-04-05 ENCOUNTER — Emergency Department (HOSPITAL_COMMUNITY): Payer: Self-pay

## 2016-04-05 ENCOUNTER — Emergency Department (HOSPITAL_COMMUNITY)
Admission: EM | Admit: 2016-04-05 | Discharge: 2016-04-05 | Disposition: A | Discharge status: Home / Self Care | Payer: Self-pay | Attending: Emergency Medicine | Admitting: Emergency Medicine

## 2016-04-05 ENCOUNTER — Encounter (HOSPITAL_COMMUNITY): Payer: Self-pay

## 2016-04-05 DIAGNOSIS — J45909 Unspecified asthma, uncomplicated: Secondary | ICD-10-CM | POA: Insufficient documentation

## 2016-04-05 DIAGNOSIS — F17210 Nicotine dependence, cigarettes, uncomplicated: Secondary | ICD-10-CM | POA: Insufficient documentation

## 2016-04-05 DIAGNOSIS — R1032 Left lower quadrant pain: Secondary | ICD-10-CM

## 2016-04-05 DIAGNOSIS — R112 Nausea with vomiting, unspecified: Secondary | ICD-10-CM | POA: Insufficient documentation

## 2016-04-05 DIAGNOSIS — Z79899 Other long term (current) drug therapy: Secondary | ICD-10-CM | POA: Insufficient documentation

## 2016-04-05 DIAGNOSIS — R197 Diarrhea, unspecified: Secondary | ICD-10-CM | POA: Insufficient documentation

## 2016-04-05 LAB — COMPREHENSIVE METABOLIC PANEL
ALT: 12 U/L — ABNORMAL LOW (ref 14–54)
ANION GAP: 5 (ref 5–15)
AST: 22 U/L (ref 15–41)
Albumin: 3.7 g/dL (ref 3.5–5.0)
Alkaline Phosphatase: 62 U/L (ref 38–126)
BILIRUBIN TOTAL: 0.7 mg/dL (ref 0.3–1.2)
BUN: 12 mg/dL (ref 6–20)
CALCIUM: 9.2 mg/dL (ref 8.9–10.3)
CO2: 24 mmol/L (ref 22–32)
Chloride: 109 mmol/L (ref 101–111)
Creatinine, Ser: 0.91 mg/dL (ref 0.44–1.00)
GFR calc Af Amer: >60 mL/min (ref >60)
Glucose, Bld: 99 mg/dL (ref 65–99)
POTASSIUM: 3.7 mmol/L (ref 3.5–5.1)
Sodium: 138 mmol/L (ref 135–145)
TOTAL PROTEIN: 7.1 g/dL (ref 6.5–8.1)

## 2016-04-05 LAB — CBC
HEMATOCRIT: 43.3 % (ref 36.0–46.0)
HEMOGLOBIN: 14.8 g/dL (ref 12.0–15.0)
MCH: 29.2 pg (ref 26.0–34.0)
MCHC: 34.2 g/dL (ref 30.0–36.0)
MCV: 85.6 fL (ref 78.0–100.0)
Platelets: 285 10*3/uL (ref 150–400)
RBC: 5.06 MIL/uL (ref 3.87–5.11)
RDW: 14.6 % (ref 11.5–15.5)
WBC: 5.2 10*3/uL (ref 4.0–10.5)

## 2016-04-05 LAB — URINALYSIS, ROUTINE W REFLEX MICROSCOPIC
Bilirubin Urine: NEGATIVE
Glucose, UA: NEGATIVE mg/dL
Hgb urine dipstick: NEGATIVE
KETONES UR: NEGATIVE mg/dL
LEUKOCYTES UA: NEGATIVE
NITRITE: NEGATIVE
PH: 6 (ref 5.0–8.0)
Protein, ur: NEGATIVE mg/dL
SPECIFIC GRAVITY, URINE: 1.017 (ref 1.005–1.030)

## 2016-04-05 LAB — LIPASE, BLOOD: Lipase: 18 U/L (ref 11–51)

## 2016-04-05 LAB — POC URINE PREG, ED: PREG TEST UR: NEGATIVE

## 2016-04-05 MED ORDER — SODIUM CHLORIDE 0.9 % IV BOLUS (SEPSIS)
1000.0000 mL | Freq: Once | INTRAVENOUS | Status: AC
  Administered 2016-04-05: 1000 mL via INTRAVENOUS

## 2016-04-05 MED ORDER — IOPAMIDOL (ISOVUE-300) INJECTION 61%
INTRAVENOUS | Status: AC
  Administered 2016-04-05: 100 mL
  Filled 2016-04-05: qty 100

## 2016-04-05 MED ORDER — ONDANSETRON HCL 4 MG/2ML IJ SOLN
4.0000 mg | Freq: Once | INTRAMUSCULAR | Status: AC
  Administered 2016-04-05: 4 mg via INTRAVENOUS
  Filled 2016-04-05: qty 2

## 2016-04-05 MED ORDER — ONDANSETRON HCL 4 MG PO TABS: 4.0000 mg | ORAL_TABLET | Freq: Four times a day (QID) | ORAL | 0 refills | Status: AC

## 2016-04-05 MED ORDER — MORPHINE SULFATE (PF) 4 MG/ML IV SOLN
4.0000 mg | Freq: Once | INTRAVENOUS | Status: AC
  Administered 2016-04-05: 4 mg via INTRAVENOUS
  Filled 2016-04-05: qty 1

## 2016-04-05 NOTE — ED Notes (Signed)
Patient transported to CT 

## 2016-04-05 NOTE — Discharge Instructions (Signed)
Drink plenty of fluids. Zofran for nausea as prescribed as needed. Rest. Follow up with family doctor if not improving. Return if worsening symptoms.

## 2016-04-05 NOTE — ED Provider Notes (Signed)
[facility]-EMERGENCY DEPT Provider Note   CSN: [id number] Arrival date & time: 04/05/16  [id number]     History   Chief Complaint Chief Complaint  Patient presents with  . Abdominal Pain    HPI Teresa SpikeGlenda Rosales is a 50 y.o. female.  HPI Teresa Rosales is a 50 y.o. female presents to emergency department complaining of abdominal pain, nausea, vomiting, diarrhea. Patient states she woke up around 1 AM with severe left lower quadrant abdominal pain. States she took Pepto-Bismol, and shortly after started to have nausea, vomiting, diarrhea. She states that emesis is clear, denies any blood in her stool or emesis. She reports the vomiting has subsided, but continues to have loose stools and pain. She did not take any other medications. Unable to keep anything down this morning. Denies history of similar pain in the past. No prior abdominal surgeries. Denies any fever or chills. No sick contacts. Denies any urinary symptoms. No vaginal discharge or bleeding. Last menstrual cycle 03/17/16. Reports was normal.  Past Medical History:  Diagnosis Date  . Asthma     There are no active problems to display for this patient.   Past Surgical History:  Procedure Laterality Date  . KNEE SURGERY    . TUBAL LIGATION      OB History    No data available       Home Medications    Prior to Admission medications   Medication Sig Start Date End Date Taking? Authorizing Provider  albuterol (PROVENTIL HFA;VENTOLIN HFA) 108 (90 BASE) MCG/ACT inhaler Inhale 1-2 puffs into the lungs every 6 (six) hours as needed for wheezing or shortness of breath. 04/21/13   Mathis FareJennifer Lee H Presson, PA  clindamycin (CLEOCIN) 150 MG capsule Take 1 capsule (150 mg total) by mouth every 6 (six) hours. Patient not taking: Reported on 07/02/2015 02/16/12   Earley FavorGail Schulz, NP  cyclobenzaprine (FLEXERIL) 10 MG tablet Take 1 tablet (10 mg total) by mouth 2 (two) times daily as needed for muscle spasms. Patient not taking: Reported on 07/02/2015  12/04/14   Barrett HenleNicole Elizabeth Nadeau, PA-C  guaiFENesin-codeine 100-10 MG/5ML syrup Take 5 mLs by mouth every 6 (six) hours as needed for cough. 07/02/15   Courteney Lyn Mackuen, MD  ibuprofen (ADVIL,MOTRIN) 800 MG tablet Take 1 tablet (800 mg total) by mouth 3 (three) times daily. 07/02/15   Courteney Lyn Mackuen, MD  naproxen (NAPROSYN) 500 MG tablet Take 1 tablet (500 mg total) by mouth 2 (two) times daily. Patient not taking: Reported on 07/02/2015 09/05/14   Santiago GladHeather Laisure, PA-C  oseltamivir (TAMIFLU) 75 MG capsule Take 1 capsule (75 mg total) by mouth every 12 (twelve) hours. X 5 days Patient not taking: Reported on 07/02/2015 04/21/13   Ria ClockJennifer Lee H Presson, PA  oxyCODONE-acetaminophen (PERCOCET/ROXICET) 5-325 MG per tablet Take 2 tablets by mouth every 4 (four) hours as needed for pain. Patient not taking: Reported on 07/02/2015 02/16/12   Earley FavorGail Schulz, NP  predniSONE (DELTASONE) 20 MG tablet Day 1 and 2: Take 3 tabs  Day 3-5: Take 2 tabs.  Day 5-8: take 1 tab 07/02/15   Courteney Lyn Mackuen, MD  sulfamethoxazole-trimethoprim (SEPTRA DS) 800-160 MG per tablet Take 1 tablet by mouth 2 (two) times daily. Patient not taking: Reported on 07/02/2015 02/16/12   Earley FavorGail Schulz, NP  traMADol (ULTRAM) 50 MG tablet Take 1 tablet (50 mg total) by mouth every 6 (six) hours as needed. Patient not taking: Reported on 07/02/2015 09/05/14   Santiago GladHeather Laisure, PA-C    Family  History No family history on file.  Social History Social History  Substance Use Topics  . Smoking status: Current Every Day Smoker    Packs/day: 0.50    Types: Cigarettes  . Smokeless tobacco: Never Used  . Alcohol use 2.4 oz/week    4 Glasses of wine per week     Comment: weekends      Allergies   Amoxicillin   Review of Systems Review of Systems  Constitutional: Negative for chills and fever.  Respiratory: Negative for cough, chest tightness and shortness of breath.   Cardiovascular: Negative for chest pain, palpitations and leg  swelling.  Gastrointestinal: Positive for abdominal pain, diarrhea, nausea and vomiting.  Genitourinary: Negative for dysuria, flank pain, pelvic pain, vaginal bleeding, vaginal discharge and vaginal pain.  Musculoskeletal: Negative for arthralgias, myalgias, neck pain and neck stiffness.  Skin: Negative for rash.  Neurological: Negative for dizziness, weakness and headaches.  All other systems reviewed and are negative.    Physical Exam Updated Vital Signs BP 136/82   Pulse 90   Temp 98.4 F (36.9 C) (Oral)   Resp 18   Ht 5\' 2"  (1.575 m)   Wt 83.9 kg   LMP 03/17/2016   SpO2 100%   BMI 33.84 kg/m   Physical Exam  Constitutional: She is oriented to person, place, and time. She appears well-developed and well-nourished. No distress.  HENT:  Head: Normocephalic.  Eyes: Conjunctivae are normal.  Neck: Neck supple.  Cardiovascular: Normal rate, regular rhythm and normal heart sounds.   Pulmonary/Chest: Effort normal and breath sounds normal. No respiratory distress. She has no wheezes. She has no rales.  Abdominal: Soft. Bowel sounds are normal. She exhibits no distension. There is tenderness. There is guarding. There is no rebound.  RLQ and LLQ tenderness  Musculoskeletal: She exhibits no edema.  Neurological: She is alert and oriented to person, place, and time.  Skin: Skin is warm and dry.  Psychiatric: She has a normal mood and affect. Her behavior is normal.  Nursing note and vitals reviewed.    ED Treatments / Results  Labs (all labs ordered are listed, but only abnormal results are displayed) Labs Reviewed  COMPREHENSIVE METABOLIC PANEL - Abnormal; Notable for the following:       Result Value   ALT 12 (*)    All other components within normal limits  LIPASE, BLOOD  CBC  URINALYSIS, ROUTINE W REFLEX MICROSCOPIC  POC URINE PREG, ED    EKG  EKG Interpretation None       Radiology Ct Abdomen Pelvis W Contrast  Result Date: 04/05/2016 CLINICAL DATA:   Left lower quadrant pain with nausea and vomiting since 10 o'clock this morning. EXAM: CT ABDOMEN AND PELVIS WITH CONTRAST TECHNIQUE: Multidetector CT imaging of the abdomen and pelvis was performed using the standard protocol following bolus administration of intravenous contrast. CONTRAST:  ISOVUE-300 IOPAMIDOL (ISOVUE-300) INJECTION 61% COMPARISON:  None. FINDINGS: Lower chest: No acute abnormality. Hepatobiliary: No focal liver abnormality is seen. No gallstones, gallbladder wall thickening, or biliary dilatation. Pancreas: Unremarkable. No pancreatic ductal dilatation or surrounding inflammatory changes. Spleen: Normal in size without focal abnormality. Adrenals/Urinary Tract: Adrenal glands are unremarkable. Kidneys are normal, without renal calculi, focal lesion, or hydronephrosis. Bladder is unremarkable. Stomach/Bowel: Stomach is within normal limits. Appendix appears normal. No evidence of bowel wall thickening, distention, or inflammatory changes. Vascular/Lymphatic: No significant vascular findings are present. Abdominal aortic atherosclerosis. No enlarged abdominal or pelvic lymph nodes. Reproductive: Uterus and bilateral adnexa are  unremarkable. Other: Small fat containing umbilical hernia. No other abdominal wall hernia. Small amount of pelvic free fluid likely physiologic. Musculoskeletal: No acute osseous abnormality. No lytic or sclerotic osseous lesion. Bilateral facet arthropathy at L4-5 and L5-S1. Broad-based disc bulge at L4-5. IMPRESSION: 1. No acute abdominal or pelvic pathology. Electronically Signed   By: Elige KoHetal  Patel   On: 04/05/2016 09:53    Procedures Procedures (including critical care time)  Medications Ordered in ED Medications  sodium chloride 0.9 % bolus 1,000 mL (not administered)  morphine 4 MG/ML injection 4 mg (not administered)  ondansetron (ZOFRAN) injection 4 mg (not administered)     Initial Impression / Assessment and Plan / ED Course  I have reviewed  the triage vital signs and the nursing notes.  Pertinent labs & imaging results that were available during my care of the patient were reviewed by me and considered in my medical decision making (see chart for details).  Clinical Course   Patient with acute onset of left lower quadrant abdominal pain early this morning. Now with nausea, vomiting, diarrhea. States pain started out first. Continues to have severe pain with tenderness and exam. Concern for colitis versus diverticulitis. Will get CT abdomen and pelvis further evaluation, labs pending. Fluids, pain medications, antiemetics started.  11:04 AM Labs normal. CT is negative. Pt feeling better with fluids and medications. She is tolerating PO fluids. Most likely viral gastroenteritis. Home with zofran, fluids, PCP follow up.   Vitals:   04/05/16 1000 04/05/16 1015 04/05/16 1030 04/05/16 1045  BP: 154/68 133/76 124/79 130/71  Pulse: (!) 55 (!) 56 (!) 46 (!) 55  Resp:      Temp:      TempSrc:      SpO2: 99% 98% 100% 100%  Weight:      Height:         Final Clinical Impressions(s) / ED Diagnoses   Final diagnoses:  Nausea vomiting and diarrhea  Left lower quadrant pain    New Prescriptions New Prescriptions   ONDANSETRON (ZOFRAN) 4 MG TABLET    Take 1 tablet (4 mg total) by mouth every 6 (six) hours.     Jaynie Crumbleatyana Dajha Urquilla, PA-C 04/05/16 1107    Arby BarretteMarcy Pfeiffer, MD 04/06/16 [phone]

## 2016-04-05 NOTE — ED Triage Notes (Signed)
Per Pt, Pt was woken up by a sharp stabbing pain this morning at 0100. Pt reports having nausea, vomiting, and diarrhea since this time, and patient is unable to keep anything down. Pain continues. Denies urinary symptoms of blood noted in stools or vomit.

## 2016-04-05 NOTE — ED Notes (Signed)
Pt ambulated to restroom attached to room without difficulty.

## 2016-10-28 ENCOUNTER — Encounter (HOSPITAL_COMMUNITY): Payer: Self-pay | Admitting: Emergency Medicine

## 2016-10-28 ENCOUNTER — Emergency Department (HOSPITAL_COMMUNITY)
Admission: EM | Admit: 2016-10-28 | Discharge: 2016-10-28 | Disposition: A | Discharge status: Home / Self Care | Payer: 59 | Attending: Emergency Medicine | Admitting: Emergency Medicine

## 2016-10-28 ENCOUNTER — Emergency Department (HOSPITAL_COMMUNITY): Payer: 59

## 2016-10-28 DIAGNOSIS — J45909 Unspecified asthma, uncomplicated: Secondary | ICD-10-CM | POA: Diagnosis not present

## 2016-10-28 DIAGNOSIS — F17210 Nicotine dependence, cigarettes, uncomplicated: Secondary | ICD-10-CM | POA: Diagnosis not present

## 2016-10-28 DIAGNOSIS — M79672 Pain in left foot: Secondary | ICD-10-CM | POA: Diagnosis present

## 2016-10-28 DIAGNOSIS — Z79899 Other long term (current) drug therapy: Secondary | ICD-10-CM | POA: Insufficient documentation

## 2016-10-28 DIAGNOSIS — M722 Plantar fascial fibromatosis: Secondary | ICD-10-CM | POA: Diagnosis not present

## 2016-10-28 MED ORDER — NAPROXEN 500 MG PO TABS: 500.0000 mg | ORAL_TABLET | Freq: Two times a day (BID) | ORAL | 0 refills | Status: DC

## 2016-10-28 MED ORDER — ACETAMINOPHEN 325 MG PO TABS
650.0000 mg | ORAL_TABLET | Freq: Once | ORAL | Status: AC
  Administered 2016-10-28: 650 mg via ORAL
  Filled 2016-10-28: qty 2

## 2016-10-28 NOTE — ED Provider Notes (Signed)
[facility]-EMERGENCY DEPT Provider Note   CSN: [id number] Arrival date & time: 10/28/16  1203  By signing my name below, I, Rosana Fret, attest that this documentation has been prepared under the direction and in the presence of non-physician practitioner, Everlene Farrier, PA-C. Electronically Signed: Rosana Fret, ED Scribe. 10/28/16. 2:01 PM.  History   Chief Complaint Chief Complaint  Patient presents with  . Foot Pain    left   The history is provided by the patient. No language interpreter was used.   HPI Comments: Teresa Rosales is a 51 y.o. female who presents to the Emergency Department complaining of gradual onset, moderate left foot pain onset 2 days ago. No injury or trauma to the area. Pt states pain is exacerbated by bearing weight with ambulation. Pt has a job where she is on her feet a lot. Pain is on her plantar surface. Pt has tried Ibuprofen and icing the area with minimal relief. Pt reports associated tingling in her toes intermittently. Pt denies knee pain, fevers, numbness, or injury or any other complaints at this time.  Past Medical History:  Diagnosis Date  . Asthma     There are no active problems to display for this patient.   Past Surgical History:  Procedure Laterality Date  . KNEE SURGERY    . TUBAL LIGATION      OB History    No data available       Home Medications    Prior to Admission medications   Medication Sig Start Date End Date Taking? Authorizing Provider  albuterol (PROVENTIL HFA;VENTOLIN HFA) 108 (90 BASE) MCG/ACT inhaler Inhale 1-2 puffs into the lungs every 6 (six) hours as needed for wheezing or shortness of breath. 04/21/13   Presson, Mathis Fare, PA  Aspirin-Salicylamide-Caffeine (BC HEADACHE POWDER PO) Take 1 packet by mouth daily as needed. headache    [provider]  naproxen (NAPROSYN) 500 MG tablet Take 1 tablet (500 mg total) by mouth 2 (two) times daily with a meal. 10/28/16   Everlene Farrier, PA-C    ondansetron (ZOFRAN) 4 MG tablet Take 1 tablet (4 mg total) by mouth every 6 (six) hours. 04/05/16   Kirichenko, Tatyana, PA-C  predniSONE (DELTASONE) 20 MG tablet Day 1 and 2: Take 3 tabs  Day 3-5: Take 2 tabs.  Day 5-8: take 1 tab Patient not taking: Reported on 04/05/2016 07/02/15   Abelino Derrick, MD    Family History No family history on file.  Social History Social History  Substance Use Topics  . Smoking status: Current Every Day Smoker    Packs/day: 0.50    Types: Cigarettes  . Smokeless tobacco: Never Used  . Alcohol use 2.4 oz/week    4 Glasses of wine per week     Comment: weekends      Allergies   Amoxicillin   Review of Systems Review of Systems  Constitutional: Negative for fever.  Musculoskeletal: Positive for arthralgias. Negative for joint swelling and myalgias.  Skin: Negative for color change, rash and wound.  Neurological: Negative for weakness and numbness.     Physical Exam Updated Vital Signs BP (!) 154/88 (BP Location: Left Arm)   Pulse 95   Temp 99.3 F (37.4 C) (Oral)   Resp 16   Ht 5\' 2"  (1.575 m)   Wt 79.4 kg (175 lb)   LMP 10/16/2016   SpO2 98%   BMI 32.01 kg/m   Physical Exam  Constitutional: She appears well-developed and well-nourished. No distress.  Nontoxic appearing.  HENT:  Head: Normocephalic and atraumatic.  Eyes: Right eye exhibits no discharge. Left eye exhibits no discharge.  Cardiovascular: Normal rate, regular rhythm and intact distal pulses.   Bilateral dorsalis pedis and posterior tibialis pulses are intact.  Pulmonary/Chest: Effort normal. No respiratory distress.  Musculoskeletal: Normal range of motion. She exhibits tenderness. She exhibits no edema or deformity.  Tenderness across the plantar fascia on the left foot. No tenderness across her Achilles tendon. No left calf for left knee tenderness to palpation. Good range of motion of her ankle without difficulty or pain.  Neurological: She is alert. No  sensory deficit. Coordination normal.  Skin: Skin is warm and dry. Capillary refill takes less than 2 seconds. No rash noted. She is not diaphoretic. No erythema. No pallor.  Psychiatric: She has a normal mood and affect. Her behavior is normal.  Nursing note and vitals reviewed.    ED Treatments / Results  DIAGNOSTIC STUDIES: Oxygen Saturation is 98% on RA, normal by my interpretation.   COORDINATION OF CARE: 1:57 PM-Discussed next steps with pt including following up with podiatrist. Pt verbalized understanding and is agreeable with the plan.   Labs (all labs ordered are listed, but only abnormal results are displayed) Labs Reviewed - No data to display  EKG  EKG Interpretation None       Radiology Dg Foot Complete Left  Result Date: 10/28/2016 CLINICAL DATA:  Pain.  No reported injury. EXAM: LEFT FOOT - COMPLETE 3+ VIEW COMPARISON:  No recent prior . FINDINGS: No acute bony or joint abnormality identified. No evidence of fracture dislocation. Mild diffuse degenerative change. IMPRESSION: No acute abnormality. Electronically Signed   By: Maisie Fus  Register   On: 10/28/2016 12:54    Procedures Procedures (including critical care time)  Medications Ordered in ED Medications  acetaminophen (TYLENOL) tablet 650 mg (not administered)     Initial Impression / Assessment and Plan / ED Course  I have reviewed the triage vital signs and the nursing notes.  Pertinent labs & imaging results that were available during my care of the patient were reviewed by me and considered in my medical decision making (see chart for details).     This is a 51 y.o. female who presents to the Emergency Department complaining of gradual onset, moderate left foot pain onset 2 days ago. No injury or trauma to the area. Pt states pain is exacerbated by bearing weight with ambulation. Pt has a job where she is on her feet a lot. Pain is on her plantar surface. On exam the patient is afebrile  nontoxic appearing. She is neurovascularly intact. Exam is consistent with plantar fasciitis. She is tender across her plantar fascia of the left foot. No other tenderness noted. X-ray was obtained in triage of her left foot. This is unremarkable. We'll discharge with a course of naproxen. I discussed conservative methods to use at home to help with plantar fasciitis. I provided her follow-up with podiatry if her pain persists.  I advised the patient to follow-up with their primary care provider this week. I advised the patient to return to the emergency department with new or worsening symptoms or new concerns. The patient verbalized understanding and agreement with plan.      Final Clinical Impressions(s) / ED Diagnoses   Final diagnoses:  Plantar fasciitis of left foot    New Prescriptions New Prescriptions   NAPROXEN (NAPROSYN) 500 MG TABLET    Take 1 tablet (500 mg total) by mouth  2 (two) times daily with a meal.   I personally performed the services described in this documentation, which was scribed in my presence. The recorded information has been reviewed and is accurate.       Everlene FarrierDansie, Xiana Carns, PA-C 10/28/16 1409    Linwood DibblesKnapp, Jon, MD 10/28/16 [id number]

## 2016-10-28 NOTE — ED Notes (Signed)
Pt from home with c/o left heel and lateral foot pain x 1 week. 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 Pt is ambulatory without assistance. Pt states her right leg is starting to hurt due to her attempting to not put weight on her left foot

## 2017-04-02 ENCOUNTER — Emergency Department (HOSPITAL_COMMUNITY)
Admission: EM | Admit: 2017-04-02 | Discharge: 2017-04-02 | Disposition: A | Discharge status: Home / Self Care | Payer: 59 | Attending: Emergency Medicine | Admitting: Emergency Medicine

## 2017-04-02 ENCOUNTER — Ambulatory Visit (HOSPITAL_COMMUNITY)
Admit: 2017-04-02 | Discharge: 2017-04-02 | Disposition: A | Discharge status: Home / Self Care | Payer: 59 | Attending: Emergency Medicine | Admitting: Emergency Medicine

## 2017-04-02 ENCOUNTER — Other Ambulatory Visit: Payer: Self-pay

## 2017-04-02 ENCOUNTER — Encounter (HOSPITAL_COMMUNITY): Payer: Self-pay

## 2017-04-02 DIAGNOSIS — Z880 Allergy status to penicillin: Secondary | ICD-10-CM | POA: Diagnosis not present

## 2017-04-02 DIAGNOSIS — F17210 Nicotine dependence, cigarettes, uncomplicated: Secondary | ICD-10-CM | POA: Insufficient documentation

## 2017-04-02 DIAGNOSIS — M76821 Posterior tibial tendinitis, right leg: Secondary | ICD-10-CM | POA: Insufficient documentation

## 2017-04-02 DIAGNOSIS — M7989 Other specified soft tissue disorders: Secondary | ICD-10-CM | POA: Diagnosis not present

## 2017-04-02 DIAGNOSIS — M79609 Pain in unspecified limb: Secondary | ICD-10-CM | POA: Diagnosis not present

## 2017-04-02 DIAGNOSIS — R2242 Localized swelling, mass and lump, left lower limb: Secondary | ICD-10-CM | POA: Diagnosis present

## 2017-04-02 DIAGNOSIS — J45909 Unspecified asthma, uncomplicated: Secondary | ICD-10-CM | POA: Diagnosis not present

## 2017-04-02 DIAGNOSIS — M79605 Pain in left leg: Secondary | ICD-10-CM | POA: Insufficient documentation

## 2017-04-02 MED ORDER — TRAMADOL HCL 50 MG PO TABS: 50.0000 mg | ORAL_TABLET | Freq: Four times a day (QID) | ORAL | 0 refills | Status: AC | PRN

## 2017-04-02 MED ORDER — NAPROXEN 500 MG PO TABS: 500.0000 mg | ORAL_TABLET | Freq: Two times a day (BID) | ORAL | 0 refills | Status: AC

## 2017-04-02 MED ORDER — KETOROLAC TROMETHAMINE 15 MG/ML IJ SOLN
30.0000 mg | Freq: Once | INTRAMUSCULAR | Status: AC
  Administered 2017-04-02: 30 mg via INTRAMUSCULAR
  Filled 2017-04-02: qty 2

## 2017-04-02 NOTE — Discharge Instructions (Signed)
Elevate your foot. ACE wrap for compression. Ice several times a day. Crtuches as needed. Naprosyn for pain and inflammation. Tylenol for additional pain relief. Tramadol for severe pain. Follow up with either orthopedics or podiatrist. Return if worsening symptoms.

## 2017-04-02 NOTE — Progress Notes (Signed)
Left lower extremity venous duplex has been completed. Negative for DVT. [facility] Kir[facility].  04/02/17 1:44 PM Olen CordialGreg Skylinn Vialpando RVT

## 2017-04-02 NOTE — ED Triage Notes (Signed)
Patient c/o left foot pain and swelling x 5 days. Patient states the symptoms are worse.

## 2017-04-02 NOTE — ED Provider Notes (Signed)
[facility]-EMERGENCY DEPT Provider Note   CSN: [id number] Arrival date & time: 04/02/17  [id number]     History   Chief Complaint Chief Complaint  Patient presents with  . Foot Swelling    left    HPI Teresa Rosales is a 51 y.o. female.  HPI Teresa Rosales is a 51 y.o. female presents to emergency department complaining of left leg swelling.  Patient states that she had similar pain a few months ago, was seen here and diagnosed with plantar fasciitis.  She states that since then the pain has gotten much better but continued to be painful.  She has tried new orthotics, anti-inflammatories, no relief of her symptoms.  She states pain again got worse 2 days ago.  She has tried to elevate her leg.  She is taking ibuprofen.  She states this morning she could not apply any weight to it.  She denies any injuries.  She had it x-rayed last time and x-ray was negative.  She denies history of gout.  Denies any fever or chills.  States leg is very painful to the touch, around her ankle, and worse with applying pressure.   Past Medical History:  Diagnosis Date  . Asthma     There are no active problems to display for this patient.   Past Surgical History:  Procedure Laterality Date  . KNEE SURGERY    . TUBAL LIGATION      OB History    No data available       Home Medications    Prior to Admission medications   Medication Sig Start Date End Date Taking? Authorizing Provider  ibuprofen (ADVIL,MOTRIN) 200 MG tablet Take 800 mg by mouth daily as needed.   Yes [provider]  naproxen (NAPROSYN) 500 MG tablet Take 1 tablet (500 mg total) by mouth 2 (two) times daily with a meal. 10/28/16  Yes Everlene Farrieransie, William, PA-C  albuterol (PROVENTIL HFA;VENTOLIN HFA) 108 (90 BASE) MCG/ACT inhaler Inhale 1-2 puffs into the lungs every 6 (six) hours as needed for wheezing or shortness of breath. Patient not taking: Reported on 04/02/2017 04/21/13   Ria ClockPresson, Jennifer Lee H, PA    ondansetron (ZOFRAN) 4 MG tablet Take 1 tablet (4 mg total) by mouth every 6 (six) hours. Patient not taking: Reported on 04/02/2017 04/05/16   Jaynie CrumbleKirichenko, Lalita Ebel, PA-C  predniSONE (DELTASONE) 20 MG tablet Day 1 and 2: Take 3 tabs  Day 3-5: Take 2 tabs.  Day 5-8: take 1 tab Patient not taking: Reported on 04/05/2016 07/02/15   Abelino DerrickMackuen, Courteney Lyn, MD    Family History History reviewed. No pertinent family history.  Social History Social History   Tobacco Use  . Smoking status: Current Every Day Smoker    Packs/day: 0.50    Types: Cigarettes  . Smokeless tobacco: Never Used  Substance Use Topics  . Alcohol use: Yes    Alcohol/week: 2.4 oz    Types: 4 Glasses of wine per week    Comment: weekends   . Drug use: No     Allergies   Amoxicillin   Review of Systems Review of Systems  Constitutional: Negative for chills and fever.  Respiratory: Negative for cough, chest tightness and shortness of breath.   Cardiovascular: Negative for chest pain, palpitations and leg swelling.  Musculoskeletal: Positive for arthralgias. Negative for myalgias, neck pain and neck stiffness.  Skin: Negative for rash.  Neurological: Negative for dizziness, weakness and headaches.  All other systems reviewed and are  negative.    Physical Exam Updated Vital Signs BP (!) 171/90 (BP Location: Left Arm)   Pulse 67   Temp 98.4 F (36.9 C) (Oral)   Resp 18   Ht 5' 2.5" (1.588 m)   Wt 87.1 kg (192 lb)   LMP 03/03/2017   SpO2 97%   BMI 34.56 kg/m   Physical Exam  Constitutional: She appears well-developed and well-nourished. No distress.  HENT:  Head: Normocephalic.  Eyes: Conjunctivae are normal.  Neck: Neck supple.  Cardiovascular: Normal rate, regular rhythm and normal heart sounds.  Pulmonary/Chest: Effort normal and breath sounds normal. No respiratory distress. She has no wheezes. She has no rales.  Abdominal: Soft. Bowel sounds are normal. She exhibits no distension. There is no  tenderness. There is no rebound.  Musculoskeletal: She exhibits edema.  Pain and swelling to the left foot and ankle.  Diffuse tenderness.  Most of the tenderness is to the medial ankle joint over malleolus and posterior tibial tendons.  Pain with any range of motion of the ankle joint.  Pain and tenderness extends up into the calf and Achilles tendon.  Achilles tendon is intact.  Dorsal pedal pulses intact.  Neurological: She is alert.  Skin: Skin is warm and dry.  Psychiatric: She has a normal mood and affect. Her behavior is normal.  Nursing note and vitals reviewed.    ED Treatments / Results  Labs (all labs ordered are listed, but only abnormal results are displayed) Labs Reviewed - No data to display  EKG  EKG Interpretation None       Radiology No results found.  Procedures Procedures (including critical care time)  Medications Ordered in ED Medications  ketorolac (TORADOL) 15 MG/ML injection 30 mg (not administered)     Initial Impression / Assessment and Plan / ED Course  I have reviewed the triage vital signs and the nursing notes.  Pertinent labs & imaging results that were available during my care of the patient were reviewed by me and considered in my medical decision making (see chart for details).     Pt in ED with pain and swelling to the left ankle/foot.  No injuries.  Patient does have significant swelling to the foot and ankle.  There is tenderness to the calf.  Positive Homans sign.  Will get ultrasound to rule out DVT.  Most likely posterior tibial tendinitis.  Will give Toradol for pain and inflammation here.  Venous duplex negative.  Patient was given crutches and an Ace wrap.  I do suspect the patient may have posterior tibial tendinitis and given exam findings and story she provides.  Will have her continue wearing supportive shoes.  Continue to ice and elevate her ankle.  NSAIDs for pain and inflammation.  Follow-up with orthopedics.  Will give  referral.  Patient is also asking for referral to podiatrist, will provide.  Vitals:   04/02/17 0736 04/02/17 1150 04/02/17 1301 04/02/17 1423  BP: (!) 158/84 (!) 171/90    Pulse: 76 67 68 68  Resp: 16 18 18    Temp: 98.4 F (36.9 C)     TempSrc: Oral     SpO2: 99% 97% 98% 100%  Weight: 87.1 kg (192 lb)     Height: 5' 2.5" (1.588 m)        Final Clinical Impressions(s) / ED Diagnoses   Final diagnoses:  Posterior tibial tendinitis of right lower extremity    ED Discharge Orders        Ordered  naproxen (NAPROSYN) 500 MG tablet  2 times daily     04/02/17 1356    traMADol (ULTRAM) 50 MG tablet  Every 6 hours PRN     04/02/17 1356       Jaynie Crumble, PA-C 04/02/17 2227    Rolland Porter, MD 04/05/17 1526

## 2017-05-08 ENCOUNTER — Ambulatory Visit: Payer: 59 | Admitting: Podiatry

## 2017-05-08 ENCOUNTER — Encounter: Payer: Self-pay | Admitting: Podiatry

## 2017-05-08 ENCOUNTER — Ambulatory Visit (INDEPENDENT_AMBULATORY_CARE_PROVIDER_SITE_OTHER): Payer: 59

## 2017-05-08 DIAGNOSIS — M7662 Achilles tendinitis, left leg: Secondary | ICD-10-CM | POA: Diagnosis not present

## 2017-05-08 DIAGNOSIS — M722 Plantar fascial fibromatosis: Secondary | ICD-10-CM

## 2017-05-08 DIAGNOSIS — M76822 Posterior tibial tendinitis, left leg: Secondary | ICD-10-CM

## 2017-05-08 MED ORDER — PREDNISONE 10 MG PO TABS: ORAL_TABLET | ORAL | 0 refills | Status: AC

## 2017-05-09 ENCOUNTER — Encounter: Payer: Self-pay | Admitting: Podiatry

## 2017-05-11 NOTE — Progress Notes (Signed)
Subjective:   Patient ID: Teresa Rosales, female   DOB: 52 y.o.   MRN: [id number]   HPI Bonita Quin presents the office today for concerns of left ankle pain.  She points to the back of the ankle on the Achilles tendon as well as the medial ankle where she gets discomfort.  She has been seen in the emergency room for this.  She was given naproxen and tramadol which helps some but she continues to get pain.  She denies any recent injury or trauma the pain gets worse after she walks but she does get pain in the mornings when she first gets up.  She also did have an ultrasound which ruled out a blood clot she states.  She denies any redness or warmth.  She denies any claudication symptoms.  No numbness or tingling.  She has no other concerns today.   Review of Systems  All other systems reviewed and are negative.  Past Medical History:  Diagnosis Date  . Asthma     Past Surgical History:  Procedure Laterality Date  . KNEE SURGERY    . TUBAL LIGATION       Current Outpatient Medications:  .  albuterol (PROVENTIL HFA;VENTOLIN HFA) 108 (90 BASE) MCG/ACT inhaler, Inhale 1-2 puffs into the lungs every 6 (six) hours as needed for wheezing or shortness of breath. (Patient not taking: Reported on 04/02/2017), Disp: 1 Inhaler, Rfl: 0 .  ibuprofen (ADVIL,MOTRIN) 200 MG tablet, Take 800 mg by mouth daily as needed., Disp: , Rfl:  .  naproxen (NAPROSYN) 500 MG tablet, Take 1 tablet (500 mg total) by mouth 2 (two) times daily., Disp: 30 tablet, Rfl: 0 .  ondansetron (ZOFRAN) 4 MG tablet, Take 1 tablet (4 mg total) by mouth every 6 (six) hours. (Patient not taking: Reported on 04/02/2017), Disp: 12 tablet, Rfl: 0 .  predniSONE (DELTASONE) 10 MG tablet, 12 day taper dose, Disp: 48 tablet, Rfl: 0 .  predniSONE (DELTASONE) 20 MG tablet, Day 1 and 2: Take 3 tabs  Day 3-5: Take 2 tabs.  Day 5-8: take 1 tab (Patient not taking: Reported on 04/05/2016), Disp: 16 tablet, Rfl: 0 .  traMADol (ULTRAM) 50 MG tablet, Take 1  tablet (50 mg total) by mouth every 6 (six) hours as needed., Disp: 15 tablet, Rfl: 0  Allergies  Allergen Reactions  . Amoxicillin Hives and Swelling    Has patient had a PCN reaction causing immediate rash, facial/tongue/throat swelling, SOB or lightheadedness with hypotension: Yes Has patient had a PCN reaction causing severe rash involving mucus membranes or skin necrosis: Yes Has patient had a PCN reaction that required hospitalization: Yes Has patient had a PCN reaction occurring within the last 10 years: No If all of the above answers are "NO", then may proceed with Cephalosporin use.     Social History   Socioeconomic History  . Marital status: Single    Spouse name: Not on file  . Number of children: Not on file  . Years of education: Not on file  . Highest education level: Not on file  Social Needs  . Financial resource strain: Not on file  . Food insecurity - worry: Not on file  . Food insecurity - inability: Not on file  . Transportation needs - medical: Not on file  . Transportation needs - non-medical: Not on file  Occupational History  . Not on file  Tobacco Use  . Smoking status: Current Every Day Smoker    Packs/day: 0.50  Types: Cigarettes  . Smokeless tobacco: Never Used  Substance and Sexual Activity  . Alcohol use: Yes    Alcohol/week: 2.4 oz    Types: 4 Glasses of wine per week    Comment: weekends   . Drug use: No  . Sexual activity: Not on file  Other Topics Concern  . Not on file  Social History Narrative  . Not on file         Objective:  Physical Exam  General: AAO x3, NAD  Dermatological: Skin is warm, dry and supple bilateral. Nails x 10 are well manicured; remaining integument appears unremarkable at this time. There are no open sores, no preulcerative lesions, no rash or signs of infection present.  Vascular: Dorsalis Pedis artery and Posterior Tibial artery pedal pulses are 2/4 bilateral with immedate capillary fill time. Pedal  hair growth present. No varicosities and no lower extremity edema present bilateral. There is no pain with calf compression, swelling, warmth, erythema.   Neruologic: Grossly intact via light touch bilateral.  Protective threshold with Semmes Wienstein monofilament intact to all pedal sites bilateral.  Musculoskeletal: There is tenderness in the medial aspect the ankle along the course of the posterior tibial tendon just posterior to the medial malleolus as well as inferior.  Flexor tendons appear to be intact.  No pain on the peroneal tendons.  Mild tenderness along the Achilles tendon.  Janee Mornhompson test is negative and there is no defect noted.  No pain with lateral compression of the calcaneus.  Decrease in medial arch height.  No other area of tenderness identified.  There is minimal edema but there is no erythema or increase in warmth.  Muscular strength 5/5 in all groups tested bilateral.  She has difficulty doing single heel rise due to pain on the left side.  Gait: Unassisted, Nonantalgic.       Assessment:   Left posterior tibial tendon dysfunction, Achilles tendinitis     Plan:  -Treatment options discussed including all alternatives, risks, and complications -Etiology of symptoms were discussed -X-rays were obtained and reviewed with the patient.  -No evidence of acute fracture identified today -Given the continuation symptoms recommend immobilization in a cam boot.  This was dispensed today. -We will do a prolonged prednisone taper this was sent to her pharmacy today. -Ice to the area daily. -Follow-up in 2 weeks.  If symptoms continue will get an MRI.  If improving then will restart therapy and discuss more about orthotics and shoes.  Vivi BarrackMatthew R Ceasia Elwell DPM

## 2017-05-23 ENCOUNTER — Encounter: Payer: Self-pay | Admitting: Podiatry

## 2017-05-23 ENCOUNTER — Ambulatory Visit: Payer: 59 | Admitting: Podiatry

## 2017-05-23 DIAGNOSIS — M76822 Posterior tibial tendinitis, left leg: Secondary | ICD-10-CM | POA: Diagnosis not present

## 2017-05-23 DIAGNOSIS — M7662 Achilles tendinitis, left leg: Secondary | ICD-10-CM

## 2017-05-28 NOTE — Progress Notes (Signed)
Subjective: Teresa Rosales presents the office today for follow-up evaluation of left ankle pain, posterior tibial tendon dysfunction/Achilles tendinitis.  She states that she is doing much better and she is ready go back to work on Monday.  She still gets some tightness in the morning when she gets up she denies any significant pain at this time.  She denies any swelling or redness.  She finished a course of prednisone.  She was immobilized in the cam boot for some time but she is going back into regular shoe.  She has no new concerns today. Denies any systemic complaints such as fevers, chills, nausea, vomiting. No acute changes since last appointment, and no other complaints at this time.   Objective: AAO x3, NAD DP/PT pulses palpable bilaterally, CRT less than 3 seconds At this time there is no significant discomfort on the course of the posterior tibial tendon or the Achilles tendon.  There is no area of discomfort identified today there is no overlying edema, erythema, increase in warmth.  Tendons appear to be intact.  Overall she still describes some tightness but no discomfort. No open lesions or pre-ulcerative lesions.  No pain with calf compression, swelling, warmth, erythema  Assessment: Improved tendinitis left side; PTTD/Achilles  Plan: -All treatment options discussed with the patient including all alternatives, risks, complications.  -At this point she is doing better.  I want her to continue with stretching, icing exercises daily.  She still gets some discomfort going to the tightness and because of this I dispensed a night splint.  We also discussed the change in shoes as well as inserts for her shoes. -She will return to work on Monday note was provided. -Follow-up in 6 weeks as needed or sooner if concerns arise.  Call any questions or concerns and she is very happy at this time has no further questions today. -Patient encouraged to call the office with any questions, concerns, change in  symptoms.   Vivi BarrackMatthew R Tikisha Molinaro DPM

## 2017-07-04 ENCOUNTER — Ambulatory Visit: Payer: 59 | Admitting: Podiatry

## 2017-07-23 ENCOUNTER — Emergency Department (HOSPITAL_COMMUNITY)
Admission: EM | Admit: 2017-07-23 | Discharge: 2017-07-23 | Disposition: A | Discharge status: Home / Self Care | Payer: 59 | Attending: Emergency Medicine | Admitting: Emergency Medicine

## 2017-07-23 ENCOUNTER — Telehealth: Payer: Self-pay | Admitting: *Deleted

## 2017-07-23 ENCOUNTER — Telehealth: Payer: Self-pay | Admitting: Podiatry

## 2017-07-23 DIAGNOSIS — J45909 Unspecified asthma, uncomplicated: Secondary | ICD-10-CM | POA: Diagnosis not present

## 2017-07-23 DIAGNOSIS — Z79899 Other long term (current) drug therapy: Secondary | ICD-10-CM | POA: Insufficient documentation

## 2017-07-23 DIAGNOSIS — F17210 Nicotine dependence, cigarettes, uncomplicated: Secondary | ICD-10-CM | POA: Insufficient documentation

## 2017-07-23 DIAGNOSIS — M722 Plantar fascial fibromatosis: Secondary | ICD-10-CM

## 2017-07-23 DIAGNOSIS — M79672 Pain in left foot: Secondary | ICD-10-CM | POA: Diagnosis present

## 2017-07-23 MED ORDER — NAPROXEN 500 MG PO TABS: 500.0000 mg | ORAL_TABLET | Freq: Two times a day (BID) | ORAL | 0 refills | Status: AC

## 2017-07-23 NOTE — ED Provider Notes (Signed)
[facility] Note   CSN: [id number] Arrival date & time: 07/23/17  1223     History   Chief Complaint Chief Complaint  Patient presents with  . Foot Pain    HPI Teresa Rosales is a 52 y.o. female.  HPI   Teresa Rosales is a 52 year old female with a history of asthma who presents to the emergency department for evaluation of "flare up" of left plantar fasciitis.  Patient reports that she has been followed by podiatrist Dr. Ardelle AntonWagoner who is treating her for plantar fasciitis.  States that she has gotten x-rays in the past which are normal. Reports that her pain is located over the left heel and radiates over the entire plantar aspect of the foot. She was previously taking naproxen, but ran out last week. Her current "flare" of pain began four days ago. She reports pain is 7.5/10 in severity and feels aching, throbbing and stabbing. Weight bearing acutely worsens pain. She also reports first step in the morning is her worst step. 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 She has a follow up appointment with her podiatrist next month. She denies recent injury and states that the pain she is experiencing is the same as it has been in the past. She denies numbness, weakness, fever, joint swelling.   Past Medical History:  Diagnosis Date  . Asthma     There are no active problems to display for this patient.   Past Surgical History:  Procedure Laterality Date  . KNEE SURGERY    . TUBAL LIGATION       OB History   None      Home Medications    Prior to Admission medications   Medication Sig Start Date End Date Taking? Authorizing Provider  albuterol (PROVENTIL HFA;VENTOLIN HFA) 108 (90 BASE) MCG/ACT inhaler Inhale 1-2 puffs into the lungs every 6 (six) hours as needed for wheezing or shortness of breath. Patient not taking: Reported on 04/02/2017 04/21/13   Ria ClockPresson, Jennifer Lee H, PA  ibuprofen  (ADVIL,MOTRIN) 200 MG tablet Take 800 mg by mouth daily as needed.    [provider]  naproxen (NAPROSYN) 500 MG tablet Take 1 tablet (500 mg total) by mouth 2 (two) times daily. 04/02/17   Kirichenko, Tatyana, PA-C  ondansetron (ZOFRAN) 4 MG tablet Take 1 tablet (4 mg total) by mouth every 6 (six) hours. Patient not taking: Reported on 04/02/2017 04/05/16   Jaynie CrumbleKirichenko, Tatyana, PA-C  predniSONE (DELTASONE) 10 MG tablet 12 day taper dose 05/08/17   Vivi BarrackWagoner, Matthew R, DPM  predniSONE (DELTASONE) 20 MG tablet Day 1 and 2: Take 3 tabs  Day 3-5: Take 2 tabs.  Day 5-8: take 1 tab Patient not taking: Reported on 04/05/2016 07/02/15   Mackuen, Courteney Lyn, MD  traMADol (ULTRAM) 50 MG tablet Take 1 tablet (50 mg total) by mouth every 6 (six) hours as needed. 04/02/17   Jaynie CrumbleKirichenko, Tatyana, PA-C    Family History No family history on file.  Social History Social History   Tobacco Use  . Smoking status: Current Every Day Smoker    Packs/day: 0.50    Types: Cigarettes  . Smokeless tobacco: Never Used  Substance Use Topics  . Alcohol use: Yes    Alcohol/week: 2.4 oz    Types: 4 Glasses of wine per week    Comment: weekends   . Drug use: No     Allergies  Amoxicillin   Review of Systems Review of Systems  Constitutional: Negative for chills and fever.  Musculoskeletal: Positive for arthralgias (left plantar foot pain). Negative for gait problem and joint swelling.  Skin: Negative for color change and wound.  Neurological: Negative for weakness and numbness.     Physical Exam Updated Vital Signs BP (!) 159/89 (BP Location: Right Arm)   Pulse 73   Temp 98.4 F (36.9 C) (Oral)   Resp (!) 70   SpO2 97%   Physical Exam  Constitutional: She is oriented to person, place, and time. She appears well-developed and well-nourished. No distress.  HENT:  Head: Normocephalic and atraumatic.  Eyes: Right eye exhibits no discharge. Left eye exhibits no discharge.    Pulmonary/Chest: Effort normal. No respiratory distress.  Musculoskeletal:  Left foot acutely tender to palpation over the heel and over the plantar fascia. No overlying ecchymosis, erythema or warmth. No tenderness over the navicular bone or base of the fifth metatarsal. No ankle swelling or foot swelling appreciated. Full active ROM of the ankle. DP pulses 2+ bilaterally. Distal sensation to light touch intact.   Neurological: She is alert and oriented to person, place, and time. Coordination normal.  Skin: Skin is warm and dry. Capillary refill takes less than 2 seconds. She is not diaphoretic.  Psychiatric: She has a normal mood and affect. Her behavior is normal.  Nursing note and vitals reviewed.    ED Treatments / Results  Labs (all labs ordered are listed, but only abnormal results are displayed) Labs Reviewed - No data to display  EKG None  Radiology No results found.  Procedures Procedures (including critical care time)  Medications Ordered in ED Medications - No data to display   Initial Impression / Assessment and Plan / ED Course  I have reviewed the triage vital signs and the nursing notes.  Pertinent labs & imaging results that were available during my care of the patient were reviewed by me and considered in my medical decision making (see chart for details).     With a history of plantar fasciitis seen by podiatrist Dr. Ardelle Anton presents with "flareup" of pain on the plantar aspect of her foot.  She denies recent injury.  Reports that she stopped taking naproxen last week and believes this may be contributing.  On exam, no joint swelling, erythema or warmth to suggest infection.  Foot is neurovascularly intact.  Do not suspect acute fracture and discussed this with patient who agrees and declines xray.  Plan to treat with naproxen and have her start using her boot at home.  Have advised her to follow-up with her podiatrist for continuing management.  Her blood  pressure was elevated in the emergency department today, counseled her to have this rechecked with her primary care doctor.  Patient agrees and voiced understanding to the above plan and has no complaints prior to discharge.  Final Clinical Impressions(s) / ED Diagnoses   Final diagnoses:  Plantar fasciitis    ED Discharge Orders        Ordered    naproxen (NAPROSYN) 500 MG tablet  2 times daily     07/23/17 1538       Lawrence Marseilles 07/23/17 1539    Pricilla Loveless, MD 07/23/17 1610

## 2017-07-23 NOTE — Telephone Encounter (Signed)
I'm a pt of Dr. Gabriel RungWagoner's and the last time I saw him he told me I could cancel the follow up appointment if I was not having any problems and doing better. I also had FMLA paperwork filled out and whoever filled that out stated I would never have any flair ups. Well, I'm having a flair up where I cannot walk and/or work. I do not have the money to come see Dr. Ardelle AntonWagoner so I was wondering what suggestions the nurse can give me as I'm in a lot of pain. Please call me back at [phone].

## 2017-07-23 NOTE — Telephone Encounter (Signed)
Left message for pt in another Telephone Call message.

## 2017-07-23 NOTE — Discharge Instructions (Signed)
Follow up with podiatry.   Follow up with your regular doctor to have your blood pressure rechecked, it was elevated in the ER today.

## 2017-07-23 NOTE — Telephone Encounter (Signed)
Left message informing pt that she should be seen either in office or the ER, and she would need to have new FMLA papers.

## 2017-07-23 NOTE — ED Notes (Signed)
Patient verbalizes understanding of discharge instructions. Opportunity for questioning and answers were provided. Armband removed by staff, pt discharged from ED ambulatory.   

## 2017-07-23 NOTE — Telephone Encounter (Signed)
Pt states she had FMLA, but has had a flare-up and work states that [employer] will not cover this flare and she needs new paper work, but can't afford to come in, should she go to the ER.

## 2017-07-23 NOTE — ED Triage Notes (Signed)
Pt to ER for pain control of left ankle tendonitis, states is here for flare up pain control due to incapable of getting into doctors office.

## 2017-07-31 ENCOUNTER — Ambulatory Visit: Payer: 59 | Admitting: Podiatry

## 2017-07-31 ENCOUNTER — Encounter: Payer: Self-pay | Admitting: Podiatry

## 2017-07-31 DIAGNOSIS — M722 Plantar fascial fibromatosis: Secondary | ICD-10-CM | POA: Diagnosis not present

## 2017-07-31 DIAGNOSIS — M7662 Achilles tendinitis, left leg: Secondary | ICD-10-CM

## 2017-08-04 ENCOUNTER — Telehealth: Payer: Self-pay | Admitting: *Deleted

## 2017-08-04 DIAGNOSIS — M79672 Pain in left foot: Secondary | ICD-10-CM

## 2017-08-04 NOTE — Progress Notes (Signed)
Subjective: 52 year old female presents the office today for follow-up evaluation of Achilles tendinitis.  She states that she was doing after last saw her but she started to have a reoccurrence of pain she is actually go to the emergency room for this.  She states that there is some day she gets out of bed and she does has excruciating pain to her heel where she could not walk and because of this she is requesting intermittent FMLA disability.  She denies any recent injury or trauma to her feet denies any swelling or redness.  No numbness or tingling.  She has no other concerns. Denies any systemic complaints such as fevers, chills, nausea, vomiting. No acute changes since last appointment, and no other complaints at this time.   Objective: AAO x3, NAD DP/PT pulses palpable bilaterally, CRT less than 3 seconds Today there is tenderness on the plantar medial tubercle of the calcaneus at the insertion of the plantar fascia on the left foot.  There is also mild discomfort along the distal portion of the Achilles tendon.  Thompson test is negative and the Achilles tendon appears to be intact.  There is no pain with lateral compression of the calcaneus.  There is no significant edema, erythema, increase in warmth.  No other area tenderness identified at this time.  Range of motion intact. No open lesions or pre-ulcerative lesions.  No pain with calf compression, swelling, warmth, erythema  Assessment: Chronic left heel pain, plantar fasciitis, Achilles tendinitis  Plan: -All treatment options discussed with the patient including all alternatives, risks, complications.  -At this point given her ongoing symptoms in the given the fact that she is having to the emergency room on several occasions for this as well as continued pain I recommend an MRI to rule out a tear.  This was ordered for her today. -Discussed a steroid injection for the plantar fascia and she agrees with this.  See procedure note  below. -Recommended her to wear her cam boot if needed.  Ice and elevate. -Follow-up after MRI or sooner if needed. -Patient encouraged to call the office with any questions, concerns, change in symptoms.   Procedure: Injection Tendon/Ligament Discussed alternatives, risks, complications and verbal consent was obtained.  Location: Left plantar fascia at the glabrous junction; medial approach. Skin Prep: Alcohol. Injectate: 0.5cc 0.5% marcaine plain, 0.5 cc 2% lidocaine plain and, 1 cc kenalog 10. Disposition: Patient tolerated procedure well. Injection site dressed with a band-aid.  Post-injection care was discussed and return precautions discussed.   Vivi BarrackMatthew R Wagoner DPM

## 2017-08-04 NOTE — Telephone Encounter (Signed)
-----   Message from Vivi BarrackMatthew R Wagoner, DPM sent at 07/31/2017  4:24 PM EDT ----- Can you please order an MRI to rule out plantar fascial tear of the left foot? Thanks.

## 2017-08-04 NOTE — Telephone Encounter (Signed)
Orders to J. Quintana, RN for pre-cert. 

## 2017-08-13 ENCOUNTER — Ambulatory Visit
Admission: RE | Admit: 2017-08-13 | Discharge: 2017-08-13 | Disposition: A | Discharge status: Home / Self Care | Payer: 59 | Source: Ambulatory Visit | Attending: Podiatry | Admitting: Podiatry

## 2017-08-15 ENCOUNTER — Telehealth: Payer: Self-pay | Admitting: Podiatry

## 2017-08-15 NOTE — Telephone Encounter (Signed)
Attempted to call patient to go over MRI, no answer. Left voicemail to call back

## 2017-08-18 ENCOUNTER — Telehealth: Payer: Self-pay | Admitting: Podiatry

## 2017-08-18 MED ORDER — MELOXICAM 15 MG PO TABS: 15.0000 mg | ORAL_TABLET | Freq: Every day | ORAL | 0 refills | Status: AC

## 2017-08-18 NOTE — Telephone Encounter (Signed)
Dr. Ardelle Anton states pt has a lot of plantar fasciitis with a partial tear, lot of tendonitis, and should stay in the boot to rest the areas, order Meloxicam and come to the office in 2 weeks to evaluate for further treatment. Informed pt of Dr. Gabriel Rung review of results and orders, I offered to have schedulers call and she said she doesn't get off work until 4:30pm and I told pt the schedulers are her until 5:00pm if she would like to call to schedule.

## 2017-08-18 NOTE — Addendum Note (Signed)
Addended by: Alphia Kava D on: 08/18/2017 05:17 PM   Modules accepted: Orders

## 2017-08-18 NOTE — Telephone Encounter (Signed)
Dr Ardelle Anton called to give results of MRI on Friday and told patient to call back Monday for results. If you can call patient back at [phone]

## 2017-08-29 ENCOUNTER — Ambulatory Visit (INDEPENDENT_AMBULATORY_CARE_PROVIDER_SITE_OTHER): Payer: 59 | Admitting: Podiatry

## 2017-08-29 ENCOUNTER — Encounter: Payer: Self-pay | Admitting: Podiatry

## 2017-08-29 DIAGNOSIS — M7662 Achilles tendinitis, left leg: Secondary | ICD-10-CM

## 2017-08-29 DIAGNOSIS — M722 Plantar fascial fibromatosis: Secondary | ICD-10-CM | POA: Diagnosis not present

## 2017-09-01 NOTE — Progress Notes (Signed)
Subjective: 52 year old female presents the office today for follow-up evaluation of Achilles tendinitis, heel pain.  She states that her symptoms have been ongoing with intermittent in nature.  She does feel that she is changed today more pain to the bottom of her heels close to the back.  Denies any recent injury or trauma since I last saw her she has no new concerns.  She presents today to discuss MRI results. She has no other concerns. Denies any systemic complaints such as fevers, chills, nausea, vomiting. No acute changes since last appointment, and no other complaints at this time.   Objective: AAO x3, NAD DP/PT pulses palpable bilaterally, CRT less than 3 seconds Today there is tenderness on the plantar medial tubercle of the calcaneus at the insertion of the plantar fascia on the left foot.  There is also mild discomfort along the distal portion of the Achilles tendon.  Thompson test is negative and the Achilles tendon appears to be intact.  There is no pain with lateral compression of the calcaneus.  There is no significant edema, erythema, increase in warmth.  No other area tenderness identified at this time.  Range of motion intact.  There is no significant discomfort to her ankle.  She has minimal tenderness to the ATFL however there is no pain on the peroneal tendons or, flexor tendons.  Tendons overall appear to be intact. No open lesions or pre-ulcerative lesions.  No pain with calf compression, swelling, warmth, erythema  Assessment: Chronic left heel pain, plantar fasciitis, Achilles tendinitis  Plan: -All treatment options discussed with the patient including all alternatives, risks, complications.  -MRI results were discussed with her.  We discussed various treatment options.  At this point I want her to continue with supportive shoes we discussed inserts for her shoes.  Also discussed physical therapy I think that would be beneficial for her at this point.  Prescription was  written for physical therapy today for [facility].  Follow-up in 6 weeks or sooner if needed  MRI IMPRESSION: 1. Prominent the medial band plantar fasciitis versus mild partial tear of the plantar fascia. 2. Mild tibialis posterior tenosynovitis and distal tendinopathy, correlate clinically in assessing for tibialis posterior dysfunction. Adjacent thickening of the superomedial portion of the spring ligament. 3. Mild common peroneus tendon sheath tenosynovitis. 4. Poor definition of the ATFL suggesting chronic tear. 5. Mild dorsal spurring in the midfoot and mild spurring of the middle subtalar facets.  Vivi Barrack DPM

## 2017-10-10 ENCOUNTER — Ambulatory Visit: Payer: 59 | Admitting: Podiatry

## 2018-03-19 ENCOUNTER — Other Ambulatory Visit: Payer: Self-pay

## 2018-03-19 ENCOUNTER — Emergency Department (HOSPITAL_COMMUNITY)
Admission: EM | Admit: 2018-03-19 | Discharge: 2018-03-19 | Disposition: A | Discharge status: Home / Self Care | Payer: 59 | Attending: Emergency Medicine | Admitting: Emergency Medicine

## 2018-03-19 ENCOUNTER — Encounter (HOSPITAL_COMMUNITY): Payer: Self-pay

## 2018-03-19 DIAGNOSIS — F17210 Nicotine dependence, cigarettes, uncomplicated: Secondary | ICD-10-CM | POA: Diagnosis not present

## 2018-03-19 DIAGNOSIS — J019 Acute sinusitis, unspecified: Secondary | ICD-10-CM | POA: Diagnosis not present

## 2018-03-19 DIAGNOSIS — J45909 Unspecified asthma, uncomplicated: Secondary | ICD-10-CM | POA: Insufficient documentation

## 2018-03-19 DIAGNOSIS — Z79899 Other long term (current) drug therapy: Secondary | ICD-10-CM | POA: Insufficient documentation

## 2018-03-19 DIAGNOSIS — R05 Cough: Secondary | ICD-10-CM | POA: Diagnosis present

## 2018-03-19 MED ORDER — BENZONATATE 100 MG PO CAPS
100.0000 mg | ORAL_CAPSULE | Freq: Once | ORAL | Status: AC
  Administered 2018-03-19: 100 mg via ORAL
  Filled 2018-03-19: qty 1

## 2018-03-19 MED ORDER — BENZONATATE 100 MG PO CAPS: 100.0000 mg | ORAL_CAPSULE | Freq: Three times a day (TID) | ORAL | 0 refills | Status: AC | PRN

## 2018-03-19 MED ORDER — ALBUTEROL SULFATE HFA 108 (90 BASE) MCG/ACT IN AERS
2.0000 | INHALATION_SPRAY | Freq: Once | RESPIRATORY_TRACT | Status: AC
  Administered 2018-03-19: 2 via RESPIRATORY_TRACT
  Filled 2018-03-19: qty 6.7

## 2018-03-19 MED ORDER — AZITHROMYCIN 250 MG PO TABS: 250.0000 mg | ORAL_TABLET | Freq: Every day | ORAL | 0 refills | Status: AC

## 2018-03-19 NOTE — Discharge Instructions (Signed)
We recommend the use of Zyrtec-D or Claritin-D which can be purchased behind the car at your local pharmacy without the need for a prescription.  You have been given azithromycin to take as prescribed until finished.  Use Tessalon as needed for cough.  Use 2 puffs of an albuterol inhaler every 4-6 hours as needed for cough, wheezing, shortness of breath.  You may continue with other over-the-counter remedies as you were previously using.  Follow-up with a primary care doctor to ensure resolution of symptoms.

## 2018-03-19 NOTE — ED Triage Notes (Signed)
Pt here with generalized body aches since Saturday.  Nasal congestion along with that. Tried to hydrate over the weekend but did not get any better.  Productive mucus cough, taking musinex and Nyquil without relief.

## 2018-03-20 NOTE — ED Provider Notes (Signed)
[facility] EMERGENCY DEPARTMENT Provider Note   CSN: [id number] Arrival date & time: 03/19/18  2058    History   Chief Complaint Chief Complaint  Patient presents with  . Generalized Body Aches  . Cough    HPI Teresa Rosales is a 52 y.o. female.   52 year old female presents to the emergency department for evaluation of upper respiratory symptoms.  Notes persistent sinus congestion with associated eye pressure and headaches.  Has been experiencing a raspy voice, but denies sore throat.  Also has been experiencing chest congestion with cough.  She has been using Alka-Seltzer as well as Mucinex and NyQuil without relief.  Continues to complain of persistent body aches.  No fevers, nausea, vomiting, abdominal pain.  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  Did not receive a flu shot this year.     Past Medical History:  Diagnosis Date  . Asthma     There are no active problems to display for this patient.   Past Surgical History:  Procedure Laterality Date  . KNEE SURGERY    . TUBAL LIGATION       OB History   None      Home Medications    Prior to Admission medications   Medication Sig Start Date End Date Taking? Authorizing Provider  albuterol (PROVENTIL HFA;VENTOLIN HFA) 108 (90 BASE) MCG/ACT inhaler Inhale 1-2 puffs into the lungs every 6 (six) hours as needed for wheezing or shortness of breath. Patient not taking: Reported on 04/02/2017 04/21/13   Ria Clock, PA  azithromycin (ZITHROMAX) 250 MG tablet Take 1 tablet (250 mg total) by mouth daily. Take first 2 tablets together, then 1 every day until finished. 03/19/18   Antony Madura, PA-C  benzonatate (TESSALON) 100 MG capsule Take 1 capsule (100 mg total) by mouth 3 (three) times daily as needed for cough. 03/19/18   Antony Madura, PA-C  ibuprofen (ADVIL,MOTRIN) 200 MG tablet Take 800 mg by mouth daily as needed.    [provider]  meloxicam (MOBIC) 15  MG tablet Take 1 tablet (15 mg total) by mouth daily. 08/18/17   Vivi Barrack, DPM  naproxen (NAPROSYN) 500 MG tablet Take 1 tablet (500 mg total) by mouth 2 (two) times daily. 04/02/17   Kirichenko, Tatyana, PA-C  naproxen (NAPROSYN) 500 MG tablet Take 1 tablet (500 mg total) by mouth 2 (two) times daily. 07/23/17   Kellie Shropshire, PA-C  ondansetron (ZOFRAN) 4 MG tablet Take 1 tablet (4 mg total) by mouth every 6 (six) hours. Patient not taking: Reported on 04/02/2017 04/05/16   Jaynie Crumble, PA-C  predniSONE (DELTASONE) 10 MG tablet 12 day taper dose 05/08/17   Vivi Barrack, DPM  predniSONE (DELTASONE) 20 MG tablet Day 1 and 2: Take 3 tabs  Day 3-5: Take 2 tabs.  Day 5-8: take 1 tab Patient not taking: Reported on 04/05/2016 07/02/15   Mackuen, Courteney Lyn, MD  traMADol (ULTRAM) 50 MG tablet Take 1 tablet (50 mg total) by mouth every 6 (six) hours as needed. 04/02/17   Jaynie Crumble, PA-C    Family History History reviewed. No pertinent family history.  Social History Social History   Tobacco Use  . Smoking status: Current Every Day Smoker    Packs/day: 0.50    Types: Cigarettes  . Smokeless tobacco: Never Used  Substance Use Topics  . Alcohol use: Yes    Alcohol/week: 4.0 standard drinks    Types: 4  Glasses of wine per week    Comment: weekends   . Drug use: No     Allergies   Amoxicillin   Review of Systems Review of Systems Ten systems reviewed and are negative for acute change, except as noted in the HPI.    Physical Exam Updated Vital Signs BP (!) 167/90 (BP Location: Right Arm)   Pulse 84   Temp 98.6 F (37 C) (Oral)   Resp 16   SpO2 98%   Physical Exam  Constitutional: She is oriented to person, place, and time. She appears well-developed and well-nourished. No distress.  Nontoxic appearing and in NAD  HENT:  Head: Normocephalic and atraumatic.  Mild middle ear effusion bilaterally without bulging, retraction, perforation.   Audible nasal congestion.  Tolerating secretions without difficulty.  No tripoding or stridor.  Eyes: Conjunctivae and EOM are normal. No scleral icterus.  Neck: Normal range of motion.  Cardiovascular: Normal rate, regular rhythm and intact distal pulses.  Pulmonary/Chest: Effort normal. No stridor. No respiratory distress. She has no wheezes. She has no rales.  Faint expiratory wheeze with forceful coughing.  Lungs otherwise clear.  Musculoskeletal: Normal range of motion.  Neurological: She is alert and oriented to person, place, and time. She exhibits normal muscle tone. Coordination normal.  Skin: Skin is warm and dry. No rash noted. She is not diaphoretic. No erythema. No pallor.  Psychiatric: She has a normal mood and affect. Her behavior is normal.  Nursing note and vitals reviewed.    ED Treatments / Results  Labs (all labs ordered are listed, but only abnormal results are displayed) Labs Reviewed - No data to display  EKG None  Radiology No results found.  Procedures Procedures (including critical care time)  Medications Ordered in ED Medications  albuterol (PROVENTIL HFA;VENTOLIN HFA) 108 (90 Base) MCG/ACT inhaler 2 puff (2 puffs Inhalation Given 03/19/18 2245)  benzonatate (TESSALON) capsule 100 mg (100 mg Oral Given 03/19/18 2244)     Initial Impression / Assessment and Plan / ED Course  I have reviewed the triage vital signs and the nursing notes.  Pertinent labs & imaging results that were available during my care of the patient were reviewed by me and considered in my medical decision making (see chart for details).     Patient complaining of symptoms of sinusitis.  Moderate symptoms of clear/yellow nasal discharge, chest congestion, and scratchy throat with cough for less than 10 days.  Patient is afebrile.  Patient placed on course of Azithromycin given symptom persistence x 1 week.  Will also discharge with symptomatic treatment.  Patient instructions given  for warm saline nasal washes.  Recommendations for follow-up with primary care physician.  Return precautions discussed and provided.  Patient discharged in stable condition with no unaddressed concerns.   Final Clinical Impressions(s) / ED Diagnoses   Final diagnoses:  Acute non-recurrent sinusitis, unspecified location    ED Discharge Orders         Ordered    azithromycin (ZITHROMAX) 250 MG tablet  Daily     03/19/18 2239    benzonatate (TESSALON) 100 MG capsule  3 times daily PRN     03/19/18 2239           Antony MaduraHumes, Timtohy Broski, PA-C 03/20/18 0055    Azalia Bilisampos, Kevin, MD 03/20/18 0100

## 2019-02-10 IMAGING — MR MR FOOT*L* W/O CM
4 of 5 series · 23 of 40 positions shown · non-contrast
Comparison: 05/08/2017 radiographs

CLINICAL DATA: Left foot pain, plantar fasciitis.

EXAM:
MRI OF THE LEFT FOOT WITHOUT CONTRAST
TECHNIQUE: Multiplanar, multisequence MR imaging of the ankle was performed. No
intravenous contrast was administered.

[Series 4: T1 · axial · 4.0mm · 0.27mm/px · z∈[-73,+51]mm · 3 of 36 slices shown (1 of 2)]
[im 5/36]
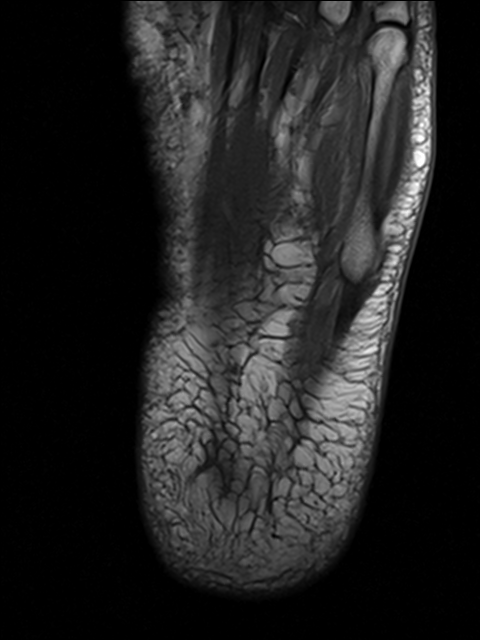
[im 18/36]
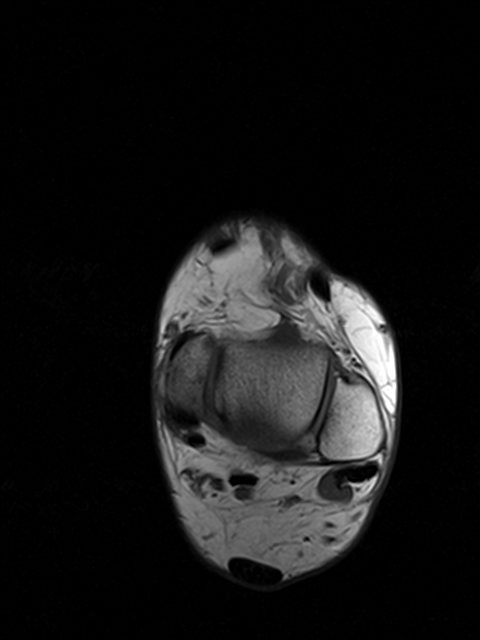
[im 31/36]
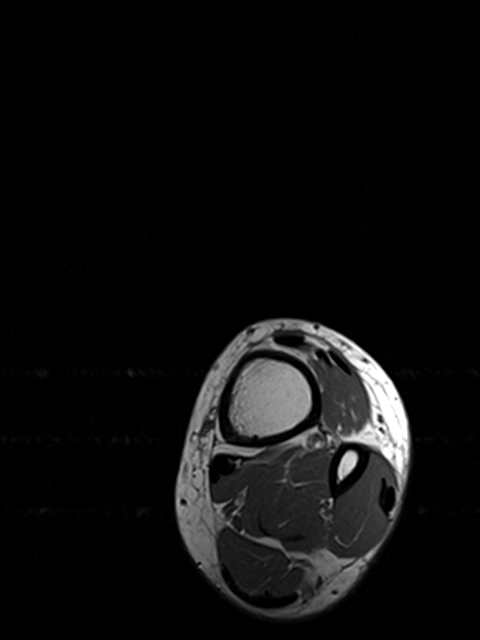

[Series 5: T1 · sagittal · 3.0mm · 0.31mm/px · 3 of 27 slices shown (2 of 2)]
[im 5/27]
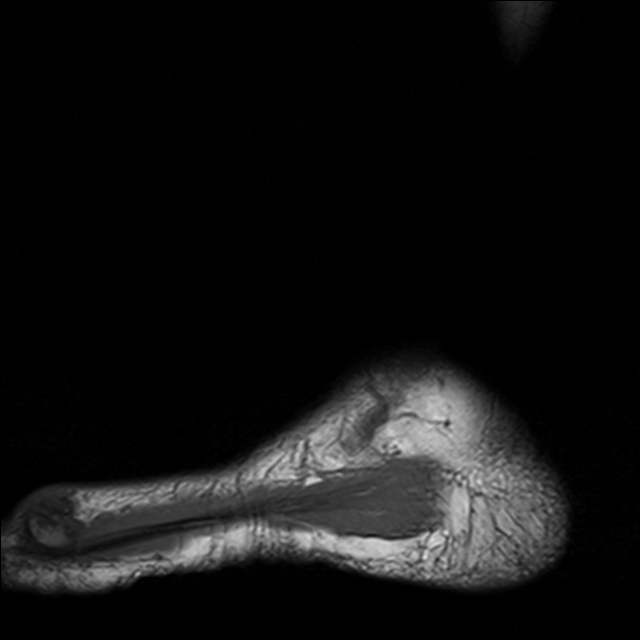
[im 14/27]
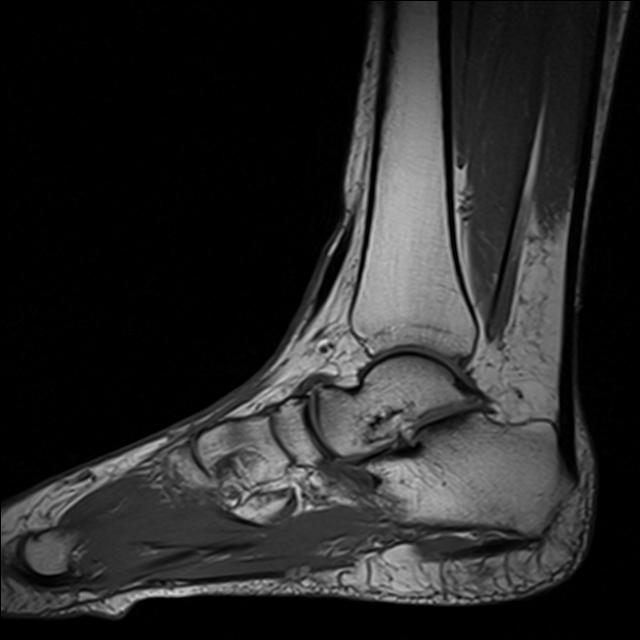
[im 22/27]
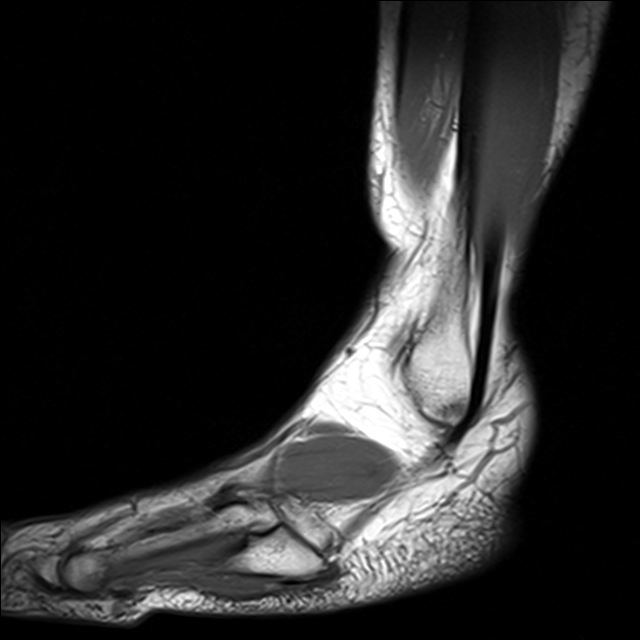

[Series 7: T2 fat-sat · axial · 4.0mm · 0.62mm/px · z∈[-92,+75]mm · 9 of 36 slices shown (1 of 2)]
[im 1/36]
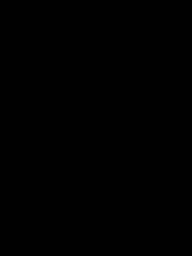
[im 5/36]
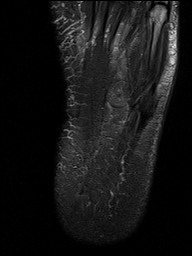
[im 9/36]
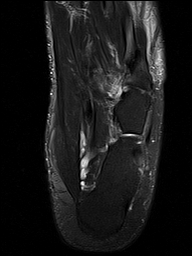
[im 14/36]
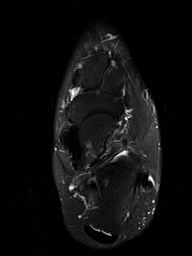
[im 18/36]
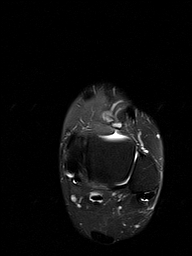
[im 22/36]
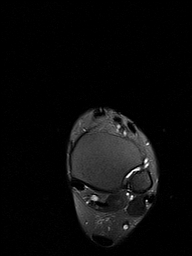
[im 27/36]
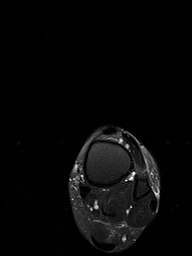
[im 31/36]
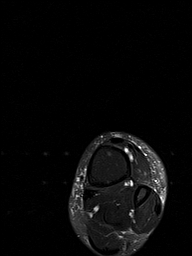
[im 36/36]
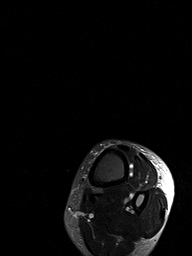

[Series 8: T2 fat-sat · coronal · 3.5mm · 0.43mm/px · 8 of 33 slices shown (2 of 2)]
[im 1/33]
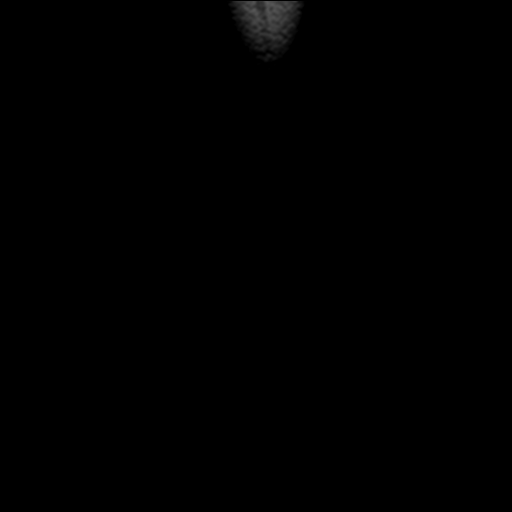
[im 5/33]
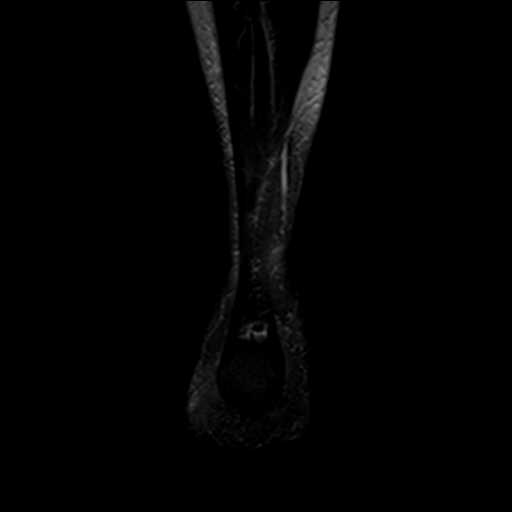
[im 10/33]
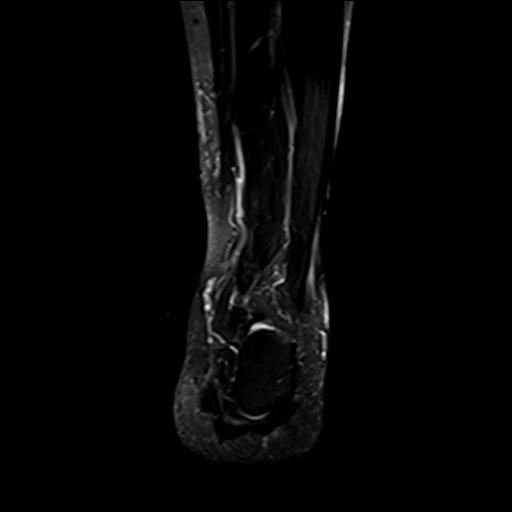
[im 14/33]
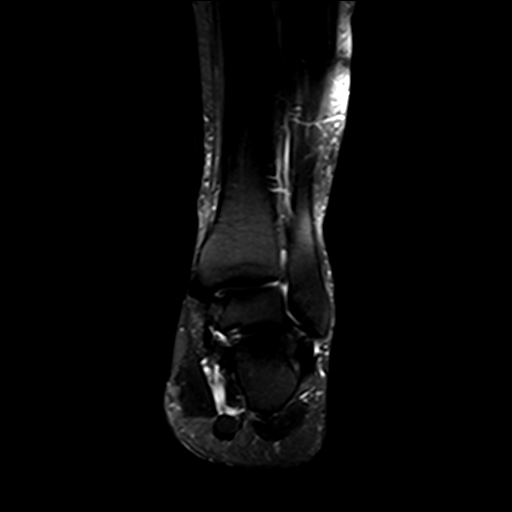
[im 19/33]
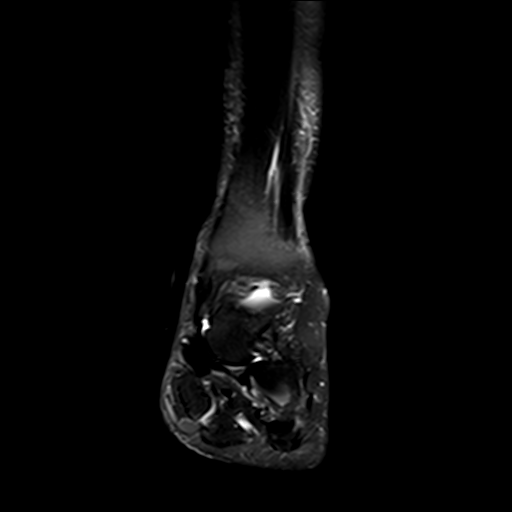
[im 23/33]
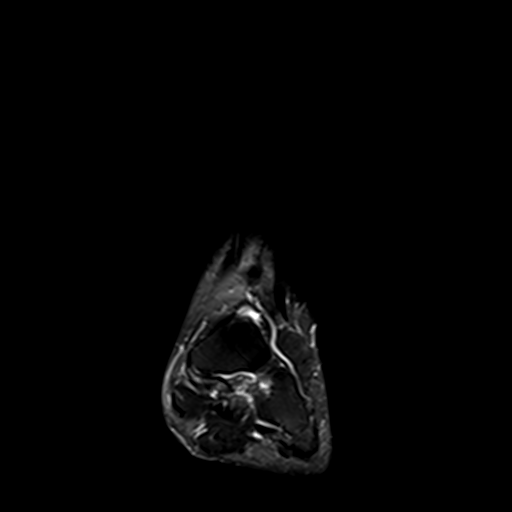
[im 28/33]
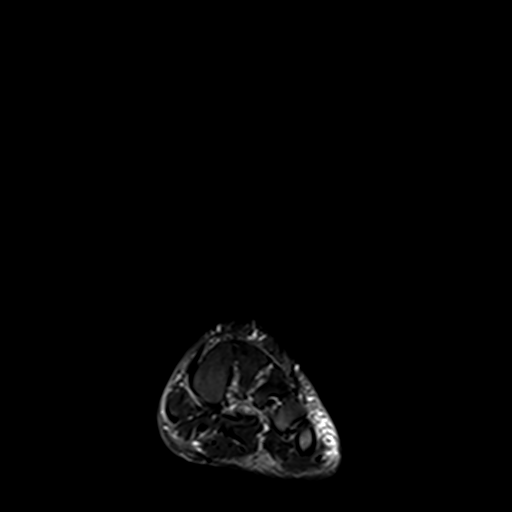
[im 33/33]
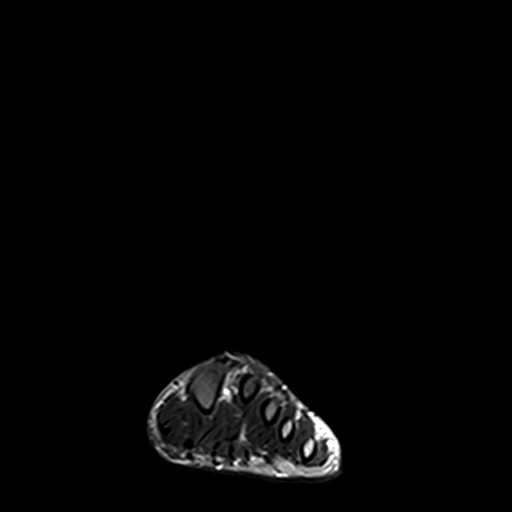

[23 of 40 positions shown; findings below may reference images not displayed]

FINDINGS: TENDONS

Peroneal: Mild common peroneus tendon sheath tenosynovitis.

Posteromedial: Mild tibialis posterior tenosynovitis. Mild distal
tibialis posterior tendinopathy.

Anterior: Unremarkable

Achilles: Trace distal Achilles tendinopathy.

Plantar Fascia: Abnormal expansion and increased signal of the
medial band of the plantar fascia near its attachment site favoring
prominent plantar fasciitis over partial tearing of the plantar
fascia. Subtle endosteal edema in the calcaneus along the plantar
fascia attachment site as on image [DATE].

LIGAMENTS

Lateral: Poor definition of the anterior talofibular ligament
without surrounding edema, suspicious for a chronic injury. No
anterolateral synovitis.

Medial: Mildly thickened superomedial component of the spring
ligament. Small normal recess between the medioplantar oblique and
inferoplantar longitudinal portions of the spring ligament.

CARTILAGE

Ankle Joint: Upper normal amount of fluid in the tibiotalar joint.

Subtalar Joints/Sinus Tarsi: Mild spurring of the middle subtalar
facets.

Bones: Mild dorsal midfoot spurring most notable at the
talonavicular articulation. Small dorsal effusion of the
talonavicular joint. Degenerative findings along the head of the
first metatarsal.

Other: No supplemental non-categorized findings.
IMPRESSION: 1. Prominent the medial band plantar fasciitis versus mild partial
tear of the plantar fascia.
2. Mild tibialis posterior tenosynovitis and distal tendinopathy,
correlate clinically in assessing for tibialis posterior
dysfunction. Adjacent thickening of the superomedial portion of the
spring ligament.
3. Mild common peroneus tendon sheath tenosynovitis.
4. Poor definition of the ATFL suggesting chronic tear.
5. Mild dorsal spurring in the midfoot and mild spurring of the
middle subtalar facets.

## 2020-08-07 ENCOUNTER — Other Ambulatory Visit: Payer: Self-pay

## 2020-08-07 ENCOUNTER — Emergency Department (HOSPITAL_COMMUNITY): Payer: 59

## 2020-08-07 ENCOUNTER — Emergency Department (HOSPITAL_COMMUNITY)
Admission: EM | Admit: 2020-08-07 | Discharge: 2020-08-07 | Disposition: A | Discharge status: Home / Self Care | Payer: 59 | Attending: Emergency Medicine | Admitting: Emergency Medicine

## 2020-08-07 ENCOUNTER — Encounter (HOSPITAL_COMMUNITY): Payer: Self-pay

## 2020-08-07 DIAGNOSIS — F17210 Nicotine dependence, cigarettes, uncomplicated: Secondary | ICD-10-CM | POA: Insufficient documentation

## 2020-08-07 DIAGNOSIS — M25462 Effusion, left knee: Secondary | ICD-10-CM | POA: Insufficient documentation

## 2020-08-07 DIAGNOSIS — J45909 Unspecified asthma, uncomplicated: Secondary | ICD-10-CM | POA: Diagnosis not present

## 2020-08-07 DIAGNOSIS — M25562 Pain in left knee: Secondary | ICD-10-CM | POA: Diagnosis present

## 2020-08-07 LAB — SYNOVIAL CELL COUNT + DIFF, W/ CRYSTALS
Crystals, Fluid: NONE SEEN
Eosinophils-Synovial: 0 % (ref 0–1)
Lymphocytes-Synovial Fld: 1 % (ref 0–20)
Monocyte-Macrophage-Synovial Fluid: 14 % — ABNORMAL LOW (ref 50–90)
Neutrophil, Synovial: 85 % — ABNORMAL HIGH (ref 0–25)
WBC, Synovial: 3700 /mm3 — ABNORMAL HIGH (ref 0–200)

## 2020-08-07 MED ORDER — METHYLPREDNISOLONE ACETATE 40 MG/ML IJ SUSP
40.0000 mg | Freq: Once | INTRAMUSCULAR | Status: AC
  Administered 2020-08-07: 40 mg via INTRA_ARTICULAR
  Filled 2020-08-07: qty 1

## 2020-08-07 MED ORDER — BUPIVACAINE HCL 0.5 % IJ SOLN
5.0000 mL | Freq: Once | INTRAMUSCULAR | Status: AC
  Administered 2020-08-07: 5 mL
  Filled 2020-08-07: qty 5

## 2020-08-07 MED ORDER — HYDROCODONE-ACETAMINOPHEN 5-325 MG PO TABS: 1.0000 | ORAL_TABLET | Freq: Four times a day (QID) | ORAL | 0 refills | Status: AC | PRN

## 2020-08-07 MED ORDER — OXYCODONE-ACETAMINOPHEN 5-325 MG PO TABS
2.0000 | ORAL_TABLET | Freq: Once | ORAL | Status: AC
  Administered 2020-08-07: 2 via ORAL
  Filled 2020-08-07: qty 2

## 2020-08-07 NOTE — Discharge Instructions (Addendum)
Rest.  Take hydrocodone as prescribed as needed for pain.  Follow-up with your primary doctor in the next week if not improving.  We will call you if your culture indicate you require further treatment or need to take additional action.

## 2020-08-07 NOTE — ED Triage Notes (Signed)
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

## 2020-08-07 NOTE — ED Triage Notes (Signed)
Left knee pain and swelling x 2 days. Hx of arthritis. Denies any recent trauma/ fall.   Tried RICE method at home but no relief.

## 2020-08-07 NOTE — ED Provider Notes (Signed)
[facility] EMERGENCY DEPARTMENT Provider Note   CSN: [id number] Arrival date & time: 08/07/20  0110     History Chief Complaint  Patient presents with  . Knee Pain    Teresa Rosales is a 55 y.o. female.  Patient is a 55 year old female with history of prior knee reconstruction due to a sports injury many years ago.  She presents today for evaluation of pain and swelling to her left knee.  She reports she was playing with her children at a park and was riding the paddle boats for an extended period of time.  Over the next several days, she has had progressively worsening pain and swelling to the knee.  She denies any fevers or chills.  The history is provided by the patient.  Knee Pain Location:  Knee Pain details:    Quality:  Throbbing   Radiates to:  Does not radiate   Severity:  Severe   Onset quality:  Gradual   Duration:  3 days   Timing:  Constant   Progression:  Worsening Chronicity:  New      Past Medical History:  Diagnosis Date  . Asthma     There are no problems to display for this patient.   Past Surgical History:  Procedure Laterality Date  . KNEE SURGERY    . TUBAL LIGATION       OB History   No obstetric history on file.     History reviewed. No pertinent family history.  Social History   Tobacco Use  . Smoking status: Current Every Day Smoker    Packs/day: 0.50    Types: Cigarettes  . Smokeless tobacco: Never Used  Vaping Use  . Vaping Use: Never used  Substance Use Topics  . Alcohol use: Yes    Alcohol/week: 4.0 standard drinks    Types: 4 Glasses of wine per week    Comment: weekends   . Drug use: No    Home Medications Prior to Admission medications   Medication Sig Start Date End Date Taking? Authorizing Provider  albuterol (PROVENTIL HFA;VENTOLIN HFA) 108 (90 BASE) MCG/ACT inhaler Inhale 1-2 puffs into the lungs every 6 (six) hours as needed for wheezing or shortness of breath. Patient not taking:  Reported on 04/02/2017 04/21/13   Ria Clock, PA  azithromycin (ZITHROMAX) 250 MG tablet Take 1 tablet (250 mg total) by mouth daily. Take first 2 tablets together, then 1 every day until finished. 03/19/18   Antony Madura, PA-C  benzonatate (TESSALON) 100 MG capsule Take 1 capsule (100 mg total) by mouth 3 (three) times daily as needed for cough. 03/19/18   Antony Madura, PA-C  ibuprofen (ADVIL,MOTRIN) 200 MG tablet Take 800 mg by mouth daily as needed.    [provider]  meloxicam (MOBIC) 15 MG tablet Take 1 tablet (15 mg total) by mouth daily. 08/18/17   Vivi Barrack, DPM  naproxen (NAPROSYN) 500 MG tablet Take 1 tablet (500 mg total) by mouth 2 (two) times daily. 04/02/17   Kirichenko, Tatyana, PA-C  naproxen (NAPROSYN) 500 MG tablet Take 1 tablet (500 mg total) by mouth 2 (two) times daily. 07/23/17   Kellie Shropshire, PA-C  ondansetron (ZOFRAN) 4 MG tablet Take 1 tablet (4 mg total) by mouth every 6 (six) hours. Patient not taking: Reported on 04/02/2017 04/05/16   Jaynie Crumble, PA-C  predniSONE (DELTASONE) 10 MG tablet 12 day taper dose 05/08/17   Vivi Barrack, DPM  predniSONE (DELTASONE) 20 MG  tablet Day 1 and 2: Take 3 tabs  Day 3-5: Take 2 tabs.  Day 5-8: take 1 tab Patient not taking: Reported on 04/05/2016 07/02/15   Mackuen, Courteney Lyn, MD  traMADol (ULTRAM) 50 MG tablet Take 1 tablet (50 mg total) by mouth every 6 (six) hours as needed. 04/02/17   Kirichenko, Lemont Fillers, PA-C    Allergies    Amoxicillin  Review of Systems   Review of Systems  All other systems reviewed and are negative.   Physical Exam Updated Vital Signs BP (!) 156/88 (BP Location: Right Arm)   Pulse 76   Temp 98.6 F (37 C) (Oral)   Resp 18   Ht 5\' 2"  (1.575 m)   Wt 87 kg   SpO2 98%   BMI 35.08 kg/m   Physical Exam Vitals and nursing note reviewed.  Constitutional:      Appearance: Normal appearance.  HENT:     Head: Normocephalic and atraumatic.  Pulmonary:      Effort: Pulmonary effort is normal.  Musculoskeletal:     Comments: The left knee has an obvious effusion.  There is no warmth or erythema.  She has pain with any range of motion limiting exam.  Skin:    General: Skin is warm and dry.  Neurological:     Mental Status: She is alert.     ED Results / Procedures / Treatments   Labs (all labs ordered are listed, but only abnormal results are displayed) Labs Reviewed - No data to display  EKG None  Radiology DG Knee Complete 4 Views Left  Result Date: 08/07/2020 CLINICAL DATA:  Left knee pain and swelling for 2 days. EXAM: LEFT KNEE - COMPLETE 4+ VIEW COMPARISON:  None. FINDINGS: Tricompartment degenerative changes in the left knee. Lateral greater than medial compartment narrowing with osteophyte formation. Large effusion. No acute fracture or dislocation is identified. 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 Electronically Signed   By: 08/09/2020 M.D.   On: 08/07/2020 02:04    Procedures .Joint Aspiration/Arthrocentesis  Date/Time: 08/07/2020 4:11 AM Performed by: 08/09/2020, MD Authorized by: Geoffery Lyons, MD   Consent:    Consent obtained:  Verbal   Consent given by:  Patient   Risks, benefits, and alternatives were discussed: yes     Risks discussed:  Bleeding, infection, pain and incomplete drainage   Alternatives discussed:  No treatment and alternative treatment Universal protocol:    Procedure explained and questions answered to patient or proxy's satisfaction: yes     Relevant documents present and verified: yes     Test results available: yes     Imaging studies available: yes     Required blood products, implants, devices, and special equipment available: yes     Patient identity confirmed:  Verbally with patient, arm band and hospital-assigned identification number Location:    Location:  Knee   Knee:  L knee Anesthesia:    Anesthesia method:  Local  infiltration   Local anesthetic:  Bupivacaine 0.5% w/o epi Procedure details:    Preparation: Patient was prepped and draped in usual sterile fashion     Needle gauge:  18 G   Ultrasound guidance: no     Approach:  Medial   Aspirate amount:  50 cc   Aspirate characteristics: Yellow-brown colored material obtained.   Steroid injected: yes     Specimen collected: yes   Post-procedure details:    Dressing:  Adhesive bandage   Procedure  completion:  Tolerated well, no immediate complications Comments:     Knee was aspirated under local anesthesia.  50 cc of yellow-brown fluid was obtained and 40 mg of Depo-Medrol were injected along with 4 cc of Marcaine.     Medications Ordered in ED Medications  methylPREDNISolone acetate (DEPO-MEDROL) injection 40 mg (has no administration in time range)  bupivacaine (MARCAINE) 0.25 % (with pres) injection 5 mL (has no administration in time range)    ED Course  I have reviewed the triage vital signs and the nursing notes.  Pertinent labs & imaging results that were available during my care of the patient were reviewed by me and considered in my medical decision making (see chart for details).    MDM Rules/Calculators/A&P  Patient presenting here with complaints of left knee pain and swelling that has worsened over the past several days.  Her x-rays show tricompartmental degenerative changes with large knee effusion.  Patient does have a very large effusion that is taut and very painful with range of motion.  Arthrocentesis was performed as per procedure note.  I was able to obtain 50 cc of yellow-brown fluid.  This will be sent for analysis.  She does not present as a septic knee.  She has no fever, there is no significant warmth or redness, and she is nontoxic in appearance.  At this point, I feel as though patient is appropriate for discharge.  If her synovial fluid shows crystals, medications for gout will be called in.  If there is any  indication of a septic knee, the patient will be notified as well.  Final Clinical Impression(s) / ED Diagnoses Final diagnoses:  None    Rx / DC Orders ED Discharge Orders    None       Geoffery Lyons, MD 08/07/20 [phone]

## 2020-08-10 LAB — BODY FLUID CULTURE W GRAM STAIN: Culture: NO GROWTH
